# Patient Record
Sex: Female | Born: 1965 | Race: White | Hispanic: No | Marital: Married | Smoking: Never smoker
Health system: Southern US, Community
[De-identification: ages and names within clinical notes are randomized; demographics above are authoritative.]

## PROBLEM LIST (undated history)

## (undated) DIAGNOSIS — E559 Vitamin D deficiency, unspecified: Secondary | ICD-10-CM

## (undated) DIAGNOSIS — N920 Excessive and frequent menstruation with regular cycle: Secondary | ICD-10-CM

## (undated) DIAGNOSIS — T7840XA Allergy, unspecified, initial encounter: Secondary | ICD-10-CM

## (undated) DIAGNOSIS — J45909 Unspecified asthma, uncomplicated: Secondary | ICD-10-CM

## (undated) DIAGNOSIS — D509 Iron deficiency anemia, unspecified: Secondary | ICD-10-CM

## (undated) DIAGNOSIS — Z86718 Personal history of other venous thrombosis and embolism: Secondary | ICD-10-CM

## (undated) DIAGNOSIS — D689 Coagulation defect, unspecified: Secondary | ICD-10-CM

## (undated) DIAGNOSIS — Z862 Personal history of diseases of the blood and blood-forming organs and certain disorders involving the immune mechanism: Secondary | ICD-10-CM

## (undated) HISTORY — DX: Coagulation defect, unspecified: D68.9

## (undated) HISTORY — DX: Excessive and frequent menstruation with regular cycle: N92.0

## (undated) HISTORY — DX: Unspecified asthma, uncomplicated: J45.909

## (undated) HISTORY — DX: Iron deficiency anemia, unspecified: D50.9

## (undated) HISTORY — DX: Allergy, unspecified, initial encounter: T78.40XA

## (undated) HISTORY — DX: Personal history of other venous thrombosis and embolism: Z86.718

## (undated) HISTORY — PX: TUBAL LIGATION: SHX77

## (undated) HISTORY — DX: Personal history of diseases of the blood and blood-forming organs and certain disorders involving the immune mechanism: Z86.2

## (undated) HISTORY — DX: Vitamin D deficiency, unspecified: E55.9

---

## 2001-10-20 ENCOUNTER — Ambulatory Visit (HOSPITAL_COMMUNITY): Admission: RE | Admit: 2001-10-20 | Discharge: 2001-10-20 | Payer: Self-pay | Admitting: Family Medicine

## 2001-10-20 ENCOUNTER — Encounter: Payer: Self-pay | Admitting: Family Medicine

## 2002-02-01 ENCOUNTER — Inpatient Hospital Stay (HOSPITAL_COMMUNITY): Admission: AD | Admit: 2002-02-01 | Discharge: 2002-02-04 | Payer: Self-pay | Admitting: Obstetrics & Gynecology

## 2002-02-13 ENCOUNTER — Inpatient Hospital Stay (HOSPITAL_COMMUNITY): Admission: AD | Admit: 2002-02-13 | Discharge: 2002-02-18 | Payer: Self-pay | Admitting: Family Medicine

## 2002-02-13 ENCOUNTER — Encounter: Payer: Self-pay | Admitting: Family Medicine

## 2002-02-14 ENCOUNTER — Encounter: Payer: Self-pay | Admitting: Family Medicine

## 2002-05-18 ENCOUNTER — Ambulatory Visit (HOSPITAL_COMMUNITY): Admission: RE | Admit: 2002-05-18 | Discharge: 2002-05-18 | Payer: Self-pay | Admitting: Family Medicine

## 2002-05-18 ENCOUNTER — Encounter: Payer: Self-pay | Admitting: Family Medicine

## 2003-04-18 ENCOUNTER — Other Ambulatory Visit: Admission: RE | Admit: 2003-04-18 | Discharge: 2003-04-18 | Payer: Self-pay | Admitting: Family Medicine

## 2003-04-25 ENCOUNTER — Encounter: Admission: RE | Admit: 2003-04-25 | Discharge: 2003-04-25 | Payer: Self-pay | Admitting: Family Medicine

## 2003-04-25 ENCOUNTER — Encounter: Payer: Self-pay | Admitting: Family Medicine

## 2003-08-15 ENCOUNTER — Ambulatory Visit (HOSPITAL_COMMUNITY): Admission: RE | Admit: 2003-08-15 | Discharge: 2003-08-15 | Payer: Self-pay | Admitting: Family Medicine

## 2012-11-02 ENCOUNTER — Other Ambulatory Visit: Payer: Self-pay

## 2012-11-02 MED ORDER — FLUTICASONE PROPIONATE 50 MCG/ACT NA SUSP: 2.0000 | Freq: Every day | NASAL | Status: DC

## 2012-11-16 ENCOUNTER — Telehealth: Payer: Self-pay | Admitting: Family Medicine

## 2012-11-16 NOTE — Telephone Encounter (Signed)
Appt made for 11-22-12

## 2012-11-22 ENCOUNTER — Ambulatory Visit (INDEPENDENT_AMBULATORY_CARE_PROVIDER_SITE_OTHER): Payer: Managed Care, Other (non HMO) | Admitting: Family Medicine

## 2012-11-22 VITALS — BP 132/83 | HR 74 | Temp 98.7°F | Resp 17 | Wt 140.0 lb

## 2012-11-22 DIAGNOSIS — R5383 Other fatigue: Secondary | ICD-10-CM | POA: Insufficient documentation

## 2012-11-22 DIAGNOSIS — J309 Allergic rhinitis, unspecified: Secondary | ICD-10-CM

## 2012-11-22 DIAGNOSIS — Z639 Problem related to primary support group, unspecified: Secondary | ICD-10-CM

## 2012-11-22 DIAGNOSIS — J302 Other seasonal allergic rhinitis: Secondary | ICD-10-CM

## 2012-11-22 DIAGNOSIS — R5381 Other malaise: Secondary | ICD-10-CM

## 2012-11-22 DIAGNOSIS — F439 Reaction to severe stress, unspecified: Secondary | ICD-10-CM

## 2012-11-22 DIAGNOSIS — B070 Plantar wart: Secondary | ICD-10-CM | POA: Insufficient documentation

## 2012-11-22 LAB — COMPLETE METABOLIC PANEL WITH GFR
ALT: 17 U/L (ref 0–35)
AST: 20 U/L (ref 0–37)
Albumin: 4.1 g/dL (ref 3.5–5.2)
Alkaline Phosphatase: 61 U/L (ref 39–117)
BUN: 5 mg/dL — ABNORMAL LOW (ref 6–23)
CO2: 31 mEq/L (ref 19–32)
Calcium: 9.5 mg/dL (ref 8.4–10.5)
Chloride: 102 mEq/L (ref 96–112)
Creat: 0.78 mg/dL (ref 0.50–1.10)
GFR, Est African American: >89 mL/min
GFR, Est Non African American: >89 mL/min
Glucose, Bld: 84 mg/dL (ref 70–99)
Potassium: 4.1 mEq/L (ref 3.5–5.3)
Sodium: 137 mEq/L (ref 135–145)
Total Bilirubin: 0.7 mg/dL (ref 0.3–1.2)
Total Protein: 6.7 g/dL (ref 6.0–8.3)

## 2012-11-22 LAB — POCT CBC
Granulocyte percent: 66.6 %G (ref 37–80)
HCT, POC: 41.5 % (ref 37.7–47.9)
Hemoglobin: 14.5 g/dL (ref 12.2–16.2)
Lymph, poc: 1.7 (ref 0.6–3.4)
MCH, POC: 32.7 pg — AB (ref 27–31.2)
MCHC: 34.8 g/dL (ref 31.8–35.4)
MCV: 93.7 fL (ref 80–97)
MPV: 6.9 fL (ref 0–99.8)
POC Granulocyte: 4.5 (ref 2–6.9)
POC LYMPH PERCENT: 25.7 %L (ref 10–50)
Platelet Count, POC: 276 10*3/uL (ref 142–424)
RBC: 4.4 M/uL (ref 4.04–5.48)
RDW, POC: 13.5 %
WBC: 6.7 10*3/uL (ref 4.6–10.2)

## 2012-11-22 LAB — TSH: TSH: 0.605 u[IU]/mL (ref 0.350–4.500)

## 2012-11-22 MED ORDER — CETIRIZINE HCL 10 MG PO TABS: 10.0000 mg | ORAL_TABLET | Freq: Every day | ORAL | Status: DC

## 2012-11-22 MED ORDER — AZELASTINE HCL 0.1 % NA SOLN: 1.0000 | Freq: Two times a day (BID) | NASAL | Status: DC

## 2012-11-22 NOTE — Progress Notes (Signed)
Patient ID: Krystal Mccoy, female   DOB: 08-Jan-1966, 47 y.o.   MRN: [id number] SUBJECTIVE: HPI: Comes in mainly for allergies. Congested for:       has had runny and itchy eyes. has had runny, itchy and stuffy nose. has had Sneezing as well. has not had Coughing has not had Wheezing  Medications used for this problem:none has not had effective response. Fatigue Patient complains of fatigue. Symptoms began about a month ago. The patient feels the fatigue began with: ever since she has had a grandchild to help take care of. Symptoms of her fatigue have been general malaise. Patient describes the following psychological symptoms: stress in the family. Patient denies change in hair texture, cold intolerance, constipation, excessive menstrual bleeding, exercise intolerance, fever, GI blood loss, significant change in weight, symptoms of arthritis, unusual rashes and witnessed or suspected sleep apnea. Symptoms have stabilized. Symptom severity: moderate. Previous visits for this problem: none.   PMH/PSH: reviewed/updated in Epic  SH/FH: reviewed/updated in Epic  Allergies: reviewed/updated in Epic  Medications: reviewed/updated in Epic  Immunizations: reviewed/updated in Epic  ROS: As above in the HPI. All other systems are stable or negative.  OBJECTIVE: APPEARANCE:  Patient in no acute distress.The patient appeared well nourished and normally developed. Acyanotic. Waist: VITAL SIGNS:There were no vitals taken for this visit.   SKIN: warm and  Dry without overt rashes, tattoos and scars  HEAD and Neck: without JVD, Head and scalp: normal Eyes:No scleral icterus. Fundi normal, eye movements normal.lacrimating , conjunctiva congested. Ears: Auricle normal, canal normal, Tympanic membranes normal, insufflation normal. Nose: swollen,boggy nasal mucosa. Rhinorrhea. Throat: post nasal drip Neck & thyroid: normal  CHEST & LUNGS: Chest wall: normal Lungs: Clear  CVS: Reveals  the PMI to be normally located. Regular rhythm, First and Second Heart sounds are normal,  absence of murmurs, rubs or gallops. Peripheral vasculature: Radial pulses: normal Dorsal pedis pulses: normal Posterior pulses: normal  ABDOMEN:  Appearance: normal Benign,, no organomegaly, no masses, no Abdominal Aortic enlargement. No Guarding , no rebound. No Bruits. Bowel sounds: normal  RECTAL: N/A GU: N/A  EXTREMETIES: nonedematous. Both Femoral and Pedal pulses are normal.  MUSCULOSKELETAL:  Spine: normal Joints: intact  NEUROLOGIC: oriented to time,place and person; nonfocal. Strength is normal Sensory is normal Reflexes are normal Cranial Nerves are normal.  ASSESSMENT: Seasonal allergic rhinitis - Plan: azelastine (ASTELIN) 137 MCG/SPRAY nasal spray, cetirizine (ZYRTEC) 10 MG tablet  Other malaise and fatigue - Plan: POCT CBC, COMPLETE METABOLIC PANEL WITH GFR, TSH, Vitamin D 25 hydroxy  Plantar wart of right foot - 4th toe  Stress at home  PLAN: Orders Placed This Encounter  Procedures  . COMPLETE METABOLIC PANEL WITH GFR  . TSH  . Vitamin D 25 hydroxy  . POCT CBC   Results for orders placed in visit on 11/22/12 (from the past 24 hour(s))  POCT CBC     Status: Abnormal   Collection Time    11/22/12 11:50 AM      Result Value Range   WBC 6.7  4.6 - 10.2 K/uL   Lymph, poc 1.7  0.6 - 3.4   POC LYMPH PERCENT 25.7  10 - 50 %L   POC Granulocyte 4.5  2 - 6.9   Granulocyte percent 66.6  37 - 80 %G   RBC 4.4  4.04 - 5.48 M/uL   Hemoglobin 14.5  12.2 - 16.2 g/dL   HCT, POC 40.9  81.1 - 47.9 %   MCV 93.7  80 - 97 fL   MCH, POC 32.7 (*) 27 - 31.2 pg   MCHC 34.8  31.8 - 35.4 g/dL   RDW, POC 29.5     Platelet Count, POC 276.0  142 - 424 K/uL   MPV 6.9  0 - 99.8 fL   Meds ordered this encounter  Medications  . azelastine (ASTELIN) 137 MCG/SPRAY nasal spray    Sig: Place 1 spray into the nose 2 (two) times daily. Use in each nostril as directed    Dispense:   30 mL    Refill:  5  . cetirizine (ZYRTEC) 10 MG tablet    Sig: Take 1 tablet (10 mg total) by mouth daily.    Dispense:  30 tablet    Refill:  5  stress reduction. Avoid  Sleep deprivation. Await lab results. RTC prn.  Tequisha Maahs P. Modesto Charon, M.D.

## 2012-11-22 NOTE — Patient Instructions (Signed)
Stress Stress-related medical problems are becoming increasingly common. The body has a built-in physical response to stressful situations. Faced with pressure, challenge or danger, we need to react quickly. Our bodies release hormones such as cortisol and adrenaline to help do this. These hormones are part of the "fight or flight" response and affect the metabolic rate, heart rate and blood pressure, resulting in a heightened, stressed state that prepares the body for optimum performance in dealing with a stressful situation. It is likely that early man required these mechanisms to stay alive, but usually modern stresses do not call for this, and the same hormones released in today's world can damage health and reduce coping ability. CAUSES  Pressure to perform at work, at school or in sports.  Threats of physical violence.  Money worries.  Arguments.  Family conflicts.  Divorce or separation from significant other.  Bereavement.  New job or unemployment.  Changes in location.  Alcohol or drug abuse. SOMETIMES, THERE IS NO PARTICULAR REASON FOR DEVELOPING STRESS. Almost all people are at risk of being stressed at some time in their lives. It is important to know that some stress is temporary and some is long term.  Temporary stress will go away when a situation is resolved. Most people can cope with short periods of stress, and it can often be relieved by relaxing, taking a walk, chatting through issues with friends, or having a good night's sleep.  Chronic (long-term, continuous) stress is much harder to deal with. It can be psychologically and emotionally damaging. It can be harmful both for an individual and for friends and family. SYMPTOMS Everyone reacts to stress differently. There are some common effects that help us recognize it. In times of extreme stress, people may:  Shake uncontrollably.  Breathe faster and deeper than normal (hyperventilate).  Vomit.  For people  with asthma, stress can trigger an attack.  For some people, stress may trigger migraine headaches, ulcers, and body pain. PHYSICAL EFFECTS OF STRESS MAY INCLUDE:  Loss of energy.  Skin problems.  Aches and pains resulting from tense muscles, including neck ache, backache and tension headaches.  Increased pain from arthritis and other conditions.  Irregular heart beat (palpitations).  Periods of irritability or anger.  Apathy or depression.  Anxiety (feeling uptight or worrying).  Unusual behavior.  Loss of appetite.  Comfort eating.  Lack of concentration.  Loss of, or decreased, sex-drive.  Increased smoking, drinking, or recreational drug use.  For women, missed periods.  Ulcers, joint pain, and muscle pain. Post-traumatic stress is the stress caused by any serious accident, strong emotional damage, or extremely difficult or violent experience such as rape or war. Post-traumatic stress victims can experience mixtures of emotions such as fear, shame, depression, guilt or anger. It may include recurrent memories or images that may be haunting. These feelings can last for weeks, months or even years after the traumatic event that triggered them. Specialized treatment, possibly with medicines and psychological therapies, is available. If stress is causing physical symptoms, severe distress or making it difficult for you to function as normal, it is worth seeing your caregiver. It is important to remember that although stress is a usual part of life, extreme or prolonged stress can lead to other illnesses that will need treatment. It is better to visit a doctor sooner rather than later. Stress has been linked to the development of high blood pressure and heart disease, as well as insomnia and depression. There is no diagnostic test for   stress since everyone reacts to it differently. But a caregiver will be able to spot the physical symptoms, such  as:  Headaches.  Shingles.  Ulcers. Emotional distress such as intense worry, low mood or irritability should be detected when the doctor asks pertinent questions to identify any underlying problems that might be the cause. In case there are physical reasons for the symptoms, the doctor may also want to do some tests to exclude certain conditions. If you feel that you are suffering from stress, try to identify the aspects of your life that are causing it. Sometimes you may not be able to change or avoid them, but even a small change can have a positive ripple effect. A simple lifestyle change can make all the difference. STRATEGIES THAT CAN HELP DEAL WITH STRESS:  Delegating or sharing responsibilities.  Avoiding confrontations.  Learning to be more assertive.  Regular exercise.  Avoid using alcohol or street drugs to cope.  Eating a healthy, balanced diet, rich in fruit and vegetables and proteins.  Finding humor or absurdity in stressful situations.  Never taking on more than you know you can handle comfortably.  Organizing your time better to get as much done as possible.  Talking to friends or family and sharing your thoughts and fears.  Listening to music or relaxation tapes.  Tensing and then relaxing your muscles, starting at the toes and working up to the head and neck. If you think that you would benefit from help, either in identifying the things that are causing your stress or in learning techniques to help you relax, see a caregiver who is capable of helping you with this. Rather than relying on medications, it is usually better to try and identify the things in your life that are causing stress and try to deal with them. There are many techniques of managing stress including counseling, psychotherapy, aromatherapy, yoga, and exercise. Your caregiver can help you determine what is best for you. Document Released: 10/11/2002 Document Revised: 10/13/2011 Document  Reviewed: 09/07/2007 [employer] Patient Information 2013 [employer].  

## 2012-11-23 ENCOUNTER — Other Ambulatory Visit: Payer: Self-pay | Admitting: Family Medicine

## 2012-11-23 ENCOUNTER — Encounter: Payer: Self-pay | Admitting: Family Medicine

## 2012-11-23 DIAGNOSIS — E559 Vitamin D deficiency, unspecified: Secondary | ICD-10-CM

## 2012-11-23 LAB — VITAMIN D 25 HYDROXY (VIT D DEFICIENCY, FRACTURES): Vit D, 25-Hydroxy: 18 ng/mL — ABNORMAL LOW (ref 30–89)

## 2012-11-23 MED ORDER — VITAMIN D (ERGOCALCIFEROL) 1.25 MG (50000 UNIT) PO CAPS: 50000.0000 [IU] | ORAL_CAPSULE | ORAL | Status: DC

## 2012-11-23 NOTE — Progress Notes (Signed)
Quick Note:  Labs abnormal. VitaminD very low. Needs to start on Vit D 50,000 IU weekly for 12 weeks then she needs to have a vit D level measured. Rx and labs ordered in EPIc  ______

## 2012-12-07 ENCOUNTER — Telehealth: Payer: Self-pay | Admitting: *Deleted

## 2012-12-07 NOTE — Telephone Encounter (Signed)
Needs another office visit to recheck. She was stressed and weak all over, since she has a grandchild to take care of.. Now its pain and weakness in the legs. OV next week. Continue with flonase, astepro, and anti-histamine and Vitamin D, in the meantime.

## 2012-12-07 NOTE — Telephone Encounter (Signed)
Continues to have weakness and a pain in both legs. Reports that the pain and weakness has worsened since her last visit.  She applies a heating pad at night to help with pain but it only provides minimal relief.  She has started taking the Vit D that was prescribed.    Also complains that her allergy symptoms have not resolved either.

## 2012-12-07 NOTE — Telephone Encounter (Signed)
SPOKE WITH PT  APPT 12-15-12 AT 10;40

## 2012-12-15 ENCOUNTER — Ambulatory Visit (INDEPENDENT_AMBULATORY_CARE_PROVIDER_SITE_OTHER): Payer: Managed Care, Other (non HMO) | Admitting: Family Medicine

## 2012-12-15 ENCOUNTER — Ambulatory Visit (INDEPENDENT_AMBULATORY_CARE_PROVIDER_SITE_OTHER): Payer: Managed Care, Other (non HMO)

## 2012-12-15 ENCOUNTER — Encounter: Payer: Self-pay | Admitting: Family Medicine

## 2012-12-15 VITALS — BP 118/75 | HR 70 | Temp 97.0°F | Wt 140.7 lb

## 2012-12-15 DIAGNOSIS — J302 Other seasonal allergic rhinitis: Secondary | ICD-10-CM

## 2012-12-15 DIAGNOSIS — R5381 Other malaise: Secondary | ICD-10-CM

## 2012-12-15 DIAGNOSIS — G8929 Other chronic pain: Secondary | ICD-10-CM | POA: Insufficient documentation

## 2012-12-15 DIAGNOSIS — M549 Dorsalgia, unspecified: Secondary | ICD-10-CM

## 2012-12-15 DIAGNOSIS — J309 Allergic rhinitis, unspecified: Secondary | ICD-10-CM

## 2012-12-15 DIAGNOSIS — M25561 Pain in right knee: Secondary | ICD-10-CM | POA: Insufficient documentation

## 2012-12-15 DIAGNOSIS — M25559 Pain in unspecified hip: Secondary | ICD-10-CM

## 2012-12-15 LAB — POCT CBC
Granulocyte percent: 76.7 %G (ref 37–80)
HCT, POC: 43.1 % (ref 37.7–47.9)
Hemoglobin: 14.4 g/dL (ref 12.2–16.2)
Lymph, poc: 1.7 (ref 0.6–3.4)
MCH, POC: 31.8 pg — AB (ref 27–31.2)
MCHC: 33.3 g/dL (ref 31.8–35.4)
MCV: 95.6 fL (ref 80–97)
MPV: 7.4 fL (ref 0–99.8)
POC Granulocyte: 7.3 — AB (ref 2–6.9)
POC LYMPH PERCENT: 18.1 %L (ref 10–50)
Platelet Count, POC: 301 10*3/uL (ref 142–424)
RBC: 4.5 M/uL (ref 4.04–5.48)
RDW, POC: 12.8 %
WBC: 9.5 10*3/uL (ref 4.6–10.2)

## 2012-12-15 LAB — VITAMIN B12: Vitamin B-12: 687 pg/mL (ref 211–911)

## 2012-12-15 LAB — FOLATE: Folate: >20 ng/mL

## 2012-12-15 LAB — CK: Total CK: 47 U/L (ref 7–177)

## 2012-12-15 MED ORDER — FLUTICASONE PROPIONATE 50 MCG/ACT NA SUSP: 2.0000 | Freq: Every day | NASAL | Status: DC

## 2012-12-15 MED ORDER — DESLORATADINE 5 MG PO TABS: 5.0000 mg | ORAL_TABLET | Freq: Every day | ORAL | Status: DC

## 2012-12-15 NOTE — Progress Notes (Signed)
Patient ID: Krystal Mccoy, female   DOB: 05/28/1966, 47 y.o.   MRN: [id number] SUBJECTIVE: HPI:  SUBJECTIVE: HPI: Complains, nothing works, has had persistent allergies fatigue and legs hurt and back sore. Has a lot of stresses related to Grandchild and family. Claims  Sleep is better but only sleeps only a few hours.   PMH/PSH: reviewed/updated in Epic  SH/FH: reviewed/updated in Epic  Allergies: reviewed/updated in Epic  Medications: reviewed/updated in Epic  Immunizations: reviewed/updated in Epic  ROS: As above in the HPI. All other systems are stable or negative.  OBJECTIVE: APPEARANCE: WF very active and fast talking. Patient in no acute distress.The patient appeared well nourished and normally developed. Acyanotic. Waist: VITAL SIGNS:BP 118/75  Pulse 70  Temp(Src) 97 F (36.1 C) (Oral)  Wt 140 lb 11.2 oz (63.821 kg)   SKIN: warm and  Dry without overt rashes, tattoos and scars  HEAD and Neck: without JVD, Head and scalp: normal Eyes:No scleral icterus. Fundi normal, eye movements normal. Ears: Auricle normal, canal normal, Tympanic membranes normal, insufflation normal. Nose: rhinitis mild. Throat: normal Neck & thyroid: normal  CHEST & LUNGS: Chest wall: normal Lungs: Clear  CVS: Reveals the PMI to be normally located. Regular rhythm, First and Second Heart sounds are normal,  absence of murmurs, rubs or gallops. Peripheral vasculature: Radial pulses: normal Dorsal pedis pulses: normal Posterior pulses: normal  ABDOMEN:  Appearance: normal Benign,, no organomegaly, no masses, no Abdominal Aortic enlargement. No Guarding , no rebound. No Bruits. Bowel sounds: normal  RECTAL: N/A GU: N/A  EXTREMETIES: nonedematous. Both Femoral and Pedal pulses are normal.  MUSCULOSKELETAL:  Spine:mild discomfort on FROM. SLR mildly positive Joints: intact Muscles, patient complains of leg muscle pains in all areas.   NEUROLOGIC: oriented to  time,place and person; nonfocal. Strength is normal Sensory is normal Reflexes are normal Cranial Nerves are normal.  ASSESSMENT: Back pain with radiation - Plan: DG Lumbar Spine 2-3 Views  Chronic arthralgias of knees and hips, unspecified laterality - Plan: Arthritis Panel, CK, Aldolase  Other malaise and fatigue - Plan: POCT CBC, Folate, Vitamin B12, Sedimentation rate  Seasonal allergic rhinitis - Plan: fluticasone (FLONASE) 50 MCG/ACT nasal spray, desloratadine (CLARINEX) 5 MG tablet  Suspect that her fatigue is due to stress , sleep deprivation and side effects of meds. Will adjust meds and evaluate labs as appropriate.  PLAN:  [facility] reading (PRIMARY) by  Dr. Modesto Charon: L5 bone spur. No acute findings  Seen.  Orders Placed This Encounter  Procedures  . DG Lumbar Spine 2-3 Views    Standing Status: Future     Number of Occurrences: 1     Standing Expiration Date: 02/14/2014    Order Specific Question:  Reason for Exam (SYMPTOM  OR DIAGNOSIS REQUIRED)    Answer:  back pain and  radiculopathy    Order Specific Question:  Is the patient pregnant?    Answer:  No    Order Specific Question:  Preferred imaging location?    Answer:  Internal  . Folate  . Vitamin B12  . Sedimentation rate  . Arthritis Panel  . CK  . Aldolase  . POCT CBC   Results for orders placed in visit on 12/15/12  POCT CBC      Result Value Range   WBC 9.5  4.6 - 10.2 K/uL   Lymph, poc 1.7  0.6 - 3.4   POC LYMPH PERCENT 18.1  10 - 50 %L   POC Granulocyte 7.3 (*) 2 -  6.9   Granulocyte percent 76.7  37 - 80 %G   RBC 4.5  4.04 - 5.48 M/uL   Hemoglobin 14.4  12.2 - 16.2 g/dL   HCT, POC 16.1  09.6 - 47.9 %   MCV 95.6  80 - 97 fL   MCH, POC 31.8 (*) 27 - 31.2 pg   MCHC 33.3  31.8 - 35.4 g/dL   RDW, POC 04.5     Platelet Count, POC 301.0  142 - 424 K/uL   MPV 7.4  0 - 99.8 fL   Meds ordered this encounter  Medications  . FeFum-FePo-FA-B Cmp-C-Zn-Mn-Cu (SE-TAN PLUS) 162-115.2-1 MG CAPS    Sig:   .  DISCONTD: FLUoxetine (PROZAC) 20 MG capsule    Sig:   . DISCONTD: chlorpheniramine-HYDROcodone (TUSSIONEX) 10-8 MG/5ML LQCR    Sig:   . valACYclovir (VALTREX) 1000 MG tablet    Sig:   . aspirin 325 MG tablet    Sig: Take 325 mg by mouth daily.  . fluticasone (FLONASE) 50 MCG/ACT nasal spray    Sig: Place 2 sprays into the nose daily.    Dispense:  16 g    Refill:  4  . desloratadine (CLARINEX) 5 MG tablet    Sig: Take 1 tablet (5 mg total) by mouth daily.    Dispense:  30 tablet    Refill:  3   RTC 4 weeks  Kadeja Granada P. Modesto Charon, M.D.

## 2012-12-16 LAB — ARTHRITIS PANEL
Anti Nuclear Antibody(ANA): NEGATIVE
Rhuematoid fact SerPl-aCnc: <10 IU/mL (ref <=14)
Uric Acid, Serum: 3.1 mg/dL (ref 2.4–6.0)

## 2012-12-16 LAB — SEDIMENTATION RATE: Sed Rate: 4 mm/hr (ref 0–22)

## 2012-12-16 NOTE — Addendum Note (Signed)
Addended by: Orma Render F on: 12/16/2012 12:06 PM   Modules accepted: Orders

## 2012-12-17 ENCOUNTER — Telehealth: Payer: Self-pay | Admitting: Family Medicine

## 2012-12-17 NOTE — Telephone Encounter (Signed)
CBC result given to pt and informed pt that all labs are not back and she verbalized understanding that she will call back next week.

## 2012-12-18 LAB — ALDOLASE: Aldolase: 5.3 U/L (ref <=8.1)

## 2012-12-22 ENCOUNTER — Telehealth: Payer: Self-pay | Admitting: Family Medicine

## 2012-12-22 NOTE — Telephone Encounter (Signed)
Work normal, so far. Keep follow up in 4 weeks. May need to see a therapist.

## 2012-12-22 NOTE — Telephone Encounter (Signed)
Pt notified that labs were normal. Pt states she is still having leg pain and having to use heating pad at night. Also still feeling very weak and drained. What to do next? Please advise

## 2012-12-28 NOTE — Telephone Encounter (Signed)
Pt notified and questions answered

## 2012-12-28 NOTE — Telephone Encounter (Signed)
5-21 LM to CB  MT

## 2012-12-30 ENCOUNTER — Telehealth: Payer: Self-pay | Admitting: Family Medicine

## 2012-12-30 NOTE — Telephone Encounter (Signed)
Right side of nose is stopped up she cant catch her breath from it. Been going on for for days do you think it is going on from the nose spray you put her on fluticasone?

## 2013-01-04 ENCOUNTER — Telehealth: Payer: Self-pay | Admitting: Family Medicine

## 2013-01-04 NOTE — Telephone Encounter (Signed)
Feels like nose stuffy on right side rt ear feels like fluid in it.  Coughing non productive  No fever  Using nasal spray and claritin

## 2013-01-11 ENCOUNTER — Telehealth: Payer: Self-pay | Admitting: Family Medicine

## 2013-01-11 DIAGNOSIS — J3489 Other specified disorders of nose and nasal sinuses: Secondary | ICD-10-CM

## 2013-01-11 NOTE — Telephone Encounter (Signed)
SPOKE WITH PT AND SHE VERBALIZED HER MESSAGES HAVE BEEN ANSWERED. BUT SHE IS STILL HAVING STUFFY NOSE  PER DR WONG TRY SALINE SPRAY AND USE CLAIRTIN AND WE WOULD REFER TO ALLERGIST.  ;PT WANTS A Monday APPT AND WEEK OF June 23 WOULD BE GOOD

## 2013-01-11 NOTE — Progress Notes (Signed)
Quick Note:  Call patient. Labs normal. No change in plan. ______ 

## 2013-01-17 ENCOUNTER — Ambulatory Visit: Payer: Managed Care, Other (non HMO) | Admitting: Family Medicine

## 2013-02-07 ENCOUNTER — Ambulatory Visit: Payer: Managed Care, Other (non HMO) | Admitting: Family Medicine

## 2013-02-07 ENCOUNTER — Telehealth: Payer: Self-pay | Admitting: Family Medicine

## 2013-02-07 NOTE — Telephone Encounter (Signed)
Left message on pt answering machine order is in computer advised to call for lab appt.

## 2013-02-14 ENCOUNTER — Other Ambulatory Visit (INDEPENDENT_AMBULATORY_CARE_PROVIDER_SITE_OTHER): Payer: Managed Care, Other (non HMO)

## 2013-02-14 DIAGNOSIS — E559 Vitamin D deficiency, unspecified: Secondary | ICD-10-CM

## 2013-02-14 NOTE — Progress Notes (Signed)
Pt came in for labs only 

## 2013-02-15 LAB — VITAMIN D 25 HYDROXY (VIT D DEFICIENCY, FRACTURES): Vit D, 25-Hydroxy: 53 ng/mL (ref 30–89)

## 2013-02-16 NOTE — Progress Notes (Signed)
Quick Note:  Lab result at goal. No change in Medications for now. No Change in plans and follow up. ______ 

## 2013-02-21 ENCOUNTER — Telehealth: Payer: Self-pay | Admitting: Family Medicine

## 2013-02-21 NOTE — Telephone Encounter (Signed)
Ear pain x 3 days.  Has taken ibuprofen and applied heat.  Appt scheduled for tomorrow.  Continue ibuprofen and heat.  Add decongestant.

## 2013-02-22 ENCOUNTER — Encounter: Payer: Self-pay | Admitting: Family Medicine

## 2013-02-22 ENCOUNTER — Ambulatory Visit (INDEPENDENT_AMBULATORY_CARE_PROVIDER_SITE_OTHER): Payer: Managed Care, Other (non HMO) | Admitting: Family Medicine

## 2013-02-22 VITALS — BP 138/71 | HR 71 | Temp 97.3°F | Ht 67.0 in | Wt 142.0 lb

## 2013-02-22 DIAGNOSIS — H8110 Benign paroxysmal vertigo, unspecified ear: Secondary | ICD-10-CM

## 2013-02-22 DIAGNOSIS — H60399 Other infective otitis externa, unspecified ear: Secondary | ICD-10-CM

## 2013-02-22 DIAGNOSIS — H6091 Unspecified otitis externa, right ear: Secondary | ICD-10-CM

## 2013-02-22 DIAGNOSIS — H6690 Otitis media, unspecified, unspecified ear: Secondary | ICD-10-CM

## 2013-02-22 MED ORDER — AMOXICILLIN 875 MG PO TABS: 875.0000 mg | ORAL_TABLET | Freq: Two times a day (BID) | ORAL | Status: DC

## 2013-02-22 MED ORDER — NEOMYCIN-POLYMYXIN-HC 3.5-10000-1 OT SOLN: 3.0000 [drp] | Freq: Four times a day (QID) | OTIC | Status: DC

## 2013-02-22 MED ORDER — MECLIZINE HCL 25 MG PO TABS: 25.0000 mg | ORAL_TABLET | Freq: Three times a day (TID) | ORAL | Status: DC | PRN

## 2013-02-22 NOTE — Progress Notes (Signed)
  Subjective:    Patient ID: Krystal Mccoy, female    DOB: 09-Nov-1965, 47 y.o.   MRN: [id number]  HPI EAR PAIN Location:  R ear  Description: R ear pain and discomfort  Onset:  3-4 days  Modifying factors: none   Symptoms  Sensation of fullness: yes Ear discharge: minimal  URI symptoms: minimal   Fever: no Tinnitus:no   Dizziness:yes; vertiginous sxs    Hearing loss:no   Toothache: nmild Rashes or lesions: no Facial muscle weakness: no  Red Flags Recent trauma: no PMH prior ear surgery:  no Diabetes or Immunosuppresion: no      Review of Systems  All other systems reviewed and are negative.       Objective:   Physical Exam  Constitutional: She appears well-developed and well-nourished.  HENT:  Head: Normocephalic and atraumatic.  Left Ear: External ear normal.  R ear canal erythema and tenderness to otoscopic evaluation R TM bulging  +nasal erythema, rhinorrhea bilaterally, + post oropharyngeal erythema    Eyes: Conjunctivae are normal. Pupils are equal, round, and reactive to light.  Neck: Normal range of motion. Neck supple.  Cardiovascular: Normal rate and regular rhythm.   Pulmonary/Chest: Effort normal and breath sounds normal.  Abdominal: Soft.  Lymphadenopathy:    She has no cervical adenopathy.  Neurological: She is alert.  Skin: Skin is warm.  Dix-Hallpike positive         Assessment & Plan:  OE (otitis externa), right - Plan: neomycin-polymyxin-hydrocortisone (CORTISPORIN) otic solution  AOM (acute otitis media), right - Plan: amoxicillin (AMOXIL) 875 MG tablet  Benign paroxysmal positional vertigo - Plan: meclizine (ANTIVERT) 25 MG tablet  Plan as above Discussed ENT red flags.  Follow up as needed.

## 2013-03-04 ENCOUNTER — Telehealth: Payer: Self-pay

## 2013-03-04 ENCOUNTER — Other Ambulatory Visit: Payer: Self-pay | Admitting: Family Medicine

## 2013-03-04 DIAGNOSIS — Z8619 Personal history of other infectious and parasitic diseases: Secondary | ICD-10-CM

## 2013-03-04 MED ORDER — ACYCLOVIR 5 % EX OINT: TOPICAL_OINTMENT | CUTANEOUS | Status: AC

## 2013-03-04 NOTE — Telephone Encounter (Signed)
Rx sent through [facility]

## 2013-04-05 ENCOUNTER — Ambulatory Visit (INDEPENDENT_AMBULATORY_CARE_PROVIDER_SITE_OTHER): Payer: Managed Care, Other (non HMO)

## 2013-04-05 ENCOUNTER — Encounter: Payer: Self-pay | Admitting: Family Medicine

## 2013-04-05 ENCOUNTER — Ambulatory Visit (INDEPENDENT_AMBULATORY_CARE_PROVIDER_SITE_OTHER): Payer: Managed Care, Other (non HMO) | Admitting: Family Medicine

## 2013-04-05 VITALS — BP 140/72 | HR 73 | Temp 97.4°F | Ht 67.0 in | Wt 145.0 lb

## 2013-04-05 DIAGNOSIS — R531 Weakness: Secondary | ICD-10-CM

## 2013-04-05 DIAGNOSIS — R1032 Left lower quadrant pain: Secondary | ICD-10-CM

## 2013-04-05 DIAGNOSIS — R5381 Other malaise: Secondary | ICD-10-CM

## 2013-04-05 LAB — POCT UA - MICROSCOPIC ONLY
Casts, Ur, LPF, POC: NEGATIVE
Crystals, Ur, HPF, POC: NEGATIVE

## 2013-04-05 LAB — POCT CBC
Granulocyte percent: 67.2 %G (ref 37–80)
HCT, POC: 39.9 % (ref 37.7–47.9)
Hemoglobin: 13.3 g/dL (ref 12.2–16.2)
Lymph, poc: 1.8 (ref 0.6–3.4)
MCH, POC: 30 pg (ref 27–31.2)
MCHC: 33.4 g/dL (ref 31.8–35.4)
MCV: 89.8 fL (ref 80–97)
MPV: 7.4 fL (ref 0–99.8)
POC Granulocyte: 4.3 (ref 2–6.9)
POC LYMPH PERCENT: 28.9 %L (ref 10–50)
Platelet Count, POC: 299 10*3/uL (ref 142–424)
RBC: 4.4 M/uL (ref 4.04–5.48)
RDW, POC: 11.6 %
WBC: 6.4 10*3/uL (ref 4.6–10.2)

## 2013-04-05 LAB — POCT URINALYSIS DIPSTICK
Bilirubin, UA: NEGATIVE
Blood, UA: NEGATIVE
Glucose, UA: NEGATIVE
Ketones, UA: NEGATIVE
Leukocytes, UA: NEGATIVE
Nitrite, UA: NEGATIVE
Protein, UA: NEGATIVE
Spec Grav, UA: 1.02
Urobilinogen, UA: NEGATIVE
pH, UA: 5

## 2013-04-05 MED ORDER — METRONIDAZOLE 500 MG PO TABS: 500.0000 mg | ORAL_TABLET | Freq: Three times a day (TID) | ORAL | Status: DC

## 2013-04-05 MED ORDER — CIPROFLOXACIN HCL 500 MG PO TABS: 500.0000 mg | ORAL_TABLET | Freq: Two times a day (BID) | ORAL | Status: DC

## 2013-04-05 NOTE — Progress Notes (Signed)
  Subjective:    Patient ID: Krystal Mccoy, female    DOB: 09-01-1965, 47 y.o.   MRN: [id number]  HPI This 47 y.o. female presents for evaluation of fatigue and malaise for over a month. She also c/o left lower abdominal pain.  She states she is sleeping all the time and is Not having any energy.    Review of Systems   No chest pain, SOB, HA, dizziness, vision change, N/V, diarrhea, constipation, dysuria, urinary urgency or frequency, myalgias, arthralgias or rash.  Objective:   Physical Exam Vital signs noted  Well developed well nourished female.  HEENT - Head atraumatic Normocephalic                Throat - oropharanx wnl Respiratory - Lungs CTA bilateral Cardiac - RRR S1 and S2 without murmur GI - Abdomen tender LLQ and bowel sounds active x 4 Extremities - No edema. Neuro - Grossly intact.        Results for orders placed in visit on 04/05/13  POCT URINALYSIS DIPSTICK      Result Value Range   Color, UA yellow     Clarity, UA clear     Glucose, UA neg     Bilirubin, UA neg     Ketones, UA neg     Spec Grav, UA 1.020     Blood, UA neg     pH, UA 5.0     Protein, UA neg     Urobilinogen, UA negative     Nitrite, UA neg     Leukocytes, UA Negative    POCT UA - MICROSCOPIC ONLY      Result Value Range   WBC, Ur, HPF, POC occ     RBC, urine, microscopic occ     Bacteria, U Microscopic few     Mucus, UA mod     Epithelial cells, urine per micros few     Crystals, Ur, HPF, POC neg     Casts, Ur, LPF, POC neg     Yeast, UA occ    POCT CBC      Result Value Range   WBC 6.4  4.6 - 10.2 K/uL   Lymph, poc 1.8  0.6 - 3.4   POC LYMPH PERCENT 28.9  10 - 50 %L   POC Granulocyte 4.3  2 - 6.9   Granulocyte percent 67.2  37 - 80 %G   RBC 4.4  4.04 - 5.48 M/uL   Hemoglobin 13.3  12.2 - 16.2 g/dL   HCT, POC 40.9  81.1 - 47.9 %   MCV 89.8  80 - 97 fL   MCH, POC 30.0  27 - 31.2 pg   MCHC 33.4  31.8 - 35.4 g/dL   RDW, POC 91.4     Platelet Count, POC 299.0  142 - 424  K/uL   MPV 7.4  0 - 99.8 fL   Assessment & Plan:  Weakness - Plan: POCT urinalysis dipstick, POCT UA - Microscopic Only, POCT CBC, CMP14+EGFR, ciprofloxacin (CIPRO) 500 MG tablet, metroNIDAZOLE (FLAGYL) 500 MG tablet  Abdominal pain, left lower quadrant - Plan: POCT CBC, DG Abd 1 View, ciprofloxacin (CIPRO) 500 MG tablet, metroNIDAZOLE (FLAGYL) 500 MG tablet.  Discussed with patient possible etiology is  Diverticulitis.  Follow up prn if not better.

## 2013-04-05 NOTE — Patient Instructions (Signed)

## 2013-04-06 LAB — CMP14+EGFR
ALT: 29 IU/L (ref 0–32)
AST: 26 IU/L (ref 0–40)
Albumin/Globulin Ratio: 2.1 (ref 1.1–2.5)
Albumin: 4.5 g/dL (ref 3.5–5.5)
Alkaline Phosphatase: 80 IU/L (ref 39–117)
BUN/Creatinine Ratio: 7 — ABNORMAL LOW (ref 9–23)
BUN: 6 mg/dL (ref 6–24)
CO2: 24 mmol/L (ref 18–29)
Calcium: 9.2 mg/dL (ref 8.7–10.2)
Chloride: 102 mmol/L (ref 97–108)
Creatinine, Ser: 0.84 mg/dL (ref 0.57–1.00)
GFR calc Af Amer: 96 mL/min/{1.73_m2} (ref >59)
GFR calc non Af Amer: 84 mL/min/{1.73_m2} (ref >59)
Globulin, Total: 2.1 g/dL (ref 1.5–4.5)
Glucose: 65 mg/dL (ref 65–99)
Potassium: 4.3 mmol/L (ref 3.5–5.2)
Sodium: 140 mmol/L (ref 134–144)
Total Bilirubin: 0.6 mg/dL (ref 0.0–1.2)
Total Protein: 6.6 g/dL (ref 6.0–8.5)

## 2013-06-13 ENCOUNTER — Ambulatory Visit (INDEPENDENT_AMBULATORY_CARE_PROVIDER_SITE_OTHER): Payer: Managed Care, Other (non HMO) | Admitting: Family Medicine

## 2013-06-13 ENCOUNTER — Encounter: Payer: Self-pay | Admitting: Family Medicine

## 2013-06-13 ENCOUNTER — Ambulatory Visit (INDEPENDENT_AMBULATORY_CARE_PROVIDER_SITE_OTHER): Payer: Managed Care, Other (non HMO)

## 2013-06-13 VITALS — BP 139/75 | HR 73 | Temp 98.1°F | Ht 65.0 in | Wt 148.6 lb

## 2013-06-13 DIAGNOSIS — M25569 Pain in unspecified knee: Secondary | ICD-10-CM

## 2013-06-13 DIAGNOSIS — R52 Pain, unspecified: Secondary | ICD-10-CM

## 2013-06-13 DIAGNOSIS — M25562 Pain in left knee: Secondary | ICD-10-CM

## 2013-06-13 MED ORDER — NAPROXEN 500 MG PO TABS: 500.0000 mg | ORAL_TABLET | Freq: Two times a day (BID) | ORAL | Status: DC

## 2013-06-13 NOTE — Progress Notes (Signed)
  Subjective:    Patient ID: Krystal Mccoy, female    DOB: [birth year], 47 y.o.   MRN: [id number]  HPI This 47 y.o. female presents for evaluation of left knee pain for 2 days. She has been experiencing moderate to severe knee arthralgias.   Review of Systems C/o left knee pain No chest pain, SOB, HA, dizziness, vision change, N/V, diarrhea, constipation, dysuria, urinary urgency or frequency or rash.     Objective:   Physical Exam Vital signs noted  Well developed well nourished female.  HEENT - Head atraumatic Normocephalic Respiratory - Lungs CTA bilateral Cardiac - RRR S1 and S2 without murmur Neuro - Grossly intact. MS - TTP left knee and discomfort with flexion and extension.  Procedure - 2 cc's of lidocaine 2 % w/o epi and one cc of depomedrol 80mg  injected Laterally under left patella after using topical anesthetic and cleansing site with ETOH pad.  Patient expresses relief immediately after injection.  Xray of left knee - No fx Prelimnary reading by Angeline Slim      Assessment & Plan:  Pain - Plan: DG Knee 1-2 Views Left  Knee pain, left - Plan: naproxen (NAPROSYN) 500 MG tablet po bid prn pain #30 Left knee injection.  Instructions given to take it easy on left knee for next couple days.  Deatra Canter FNP

## 2013-06-13 NOTE — Patient Instructions (Addendum)
Place knee pain patient instructions here. Knee Injection Joint injections are shots. Your caregiver will place a needle into your knee joint. The needle is used to put medicine into the joint. These shots can be used to help treat different painful knee conditions such as osteoarthritis, bursitis, local flare-ups of rheumatoid arthritis, and pseudogout. Anti-inflammatory medicines such as corticosteroids and anesthetics are the most common medicines used for joint and soft tissue injections.  PROCEDURE  The skin over the kneecap will be cleaned with an antiseptic solution.  Your caregiver will inject a small amount of a local anesthetic (a medicine like Novocaine) just under the skin in the area that was cleaned.  After the area becomes numb, a second injection is done. This second injection usually includes an anesthetic and an anti-inflammatory medicine called a steroid or cortisone. The needle is carefully placed in between the kneecap and the knee, and the medicine is injected into the joint space.  After the injection is done, the needle is removed. Your caregiver may place a bandage over the injection site. The whole procedure takes no more than a couple of minutes. BEFORE THE PROCEDURE  Wash all of the skin around the entire knee area. Try to remove any loose, scaling skin. There is no other specific preparation necessary unless advised otherwise by your caregiver. LET YOUR CAREGIVER KNOW ABOUT:   Allergies.  Medications taken including herbs, eye drops, over the counter medications, and creams.  Use of steroids (by mouth or creams).  Possible pregnancy, if applicable.  Previous problems with anesthetics or Novocaine.  History of blood clots (thrombophlebitis).  History of bleeding or blood problems.  Previous surgery.  Other health problems. RISKS AND COMPLICATIONS Side effects from cortisone shots are rare. They include:   Slight bruising of the skin.  Shrinkage of the  normal fatty tissue under the skin where the shot was given.  Increase in pain after the shot.  Infection.  Weakening of tendons or tendon rupture.  Allergic reaction to the medicine.  Diabetics may have a temporary increase in their blood sugar after a shot.  Cortisone can temporarily weaken the immune system. While receiving these shots, you should not get certain vaccines. Also, avoid contact with anyone who has chickenpox or measles. Especially if you have never had these diseases or have not been previously immunized. Your immune system may not be strong enough to fight off the infection while the cortisone is in your system. AFTER THE PROCEDURE   You can go home after the procedure.  You may need to put ice on the joint 15-20 minutes every 3 or 4 hours until the pain goes away.  You may need to put an elastic bandage on the joint. HOME CARE INSTRUCTIONS   Only take over-the-counter or prescription medicines for pain, discomfort, or fever as directed by your caregiver.  You should avoid stressing the joint. Unless advised otherwise, avoid activities that put a lot of pressure on a knee joint, such as:  Jogging.  Bicycling.  Recreational climbing.  Hiking.  Laying down and elevating the leg/knee above the level of your heart can help to minimize swelling. SEEK MEDICAL CARE IF:   You have repeated or worsening swelling.  There is drainage from the puncture area.  You develop red streaking that extends above or below the site where the needle was inserted. SEEK IMMEDIATE MEDICAL CARE IF:   You develop a fever.  You have pain that gets worse even though you are taking  pain medicine.  The area is red and warm, and you have trouble moving the joint. MAKE SURE YOU:   Understand these instructions.  Will watch your condition.  Will get help right away if you are not doing well or get worse. Document Released: 10/12/2006 Document Revised: 10/13/2011 Document  Reviewed: 07/09/2007 [facility] Patient Information 2014 [place], [place].

## 2013-07-04 ENCOUNTER — Ambulatory Visit (INDEPENDENT_AMBULATORY_CARE_PROVIDER_SITE_OTHER): Payer: Managed Care, Other (non HMO) | Admitting: Family Medicine

## 2013-07-04 ENCOUNTER — Encounter: Payer: Self-pay | Admitting: Family Medicine

## 2013-07-04 VITALS — BP 118/75 | HR 100 | Temp 97.8°F | Wt 148.0 lb

## 2013-07-04 DIAGNOSIS — J209 Acute bronchitis, unspecified: Secondary | ICD-10-CM

## 2013-07-04 MED ORDER — BENZONATATE 200 MG PO CAPS: 200.0000 mg | ORAL_CAPSULE | Freq: Two times a day (BID) | ORAL | Status: DC | PRN

## 2013-07-04 MED ORDER — AZITHROMYCIN 250 MG PO TABS: ORAL_TABLET | ORAL | Status: DC

## 2013-07-04 NOTE — Progress Notes (Signed)
   Subjective:    Patient ID: Krystal Mccoy, female    DOB: [birth year], 47 y.o.   MRN: [id number]  HPI  This 47 y.o. female presents for evaluation of URI sx's.  She has been coughing and  Has been feeling tired.  Review of Systems C/o uri sx's No chest pain, SOB, HA, dizziness, vision change, N/V, diarrhea, constipation, dysuria, urinary urgency or frequency, myalgias, arthralgias or rash.     Objective:   Physical Exam Vital signs noted  Well developed well nourished female.  HEENT - Head atraumatic Normocephalic                Eyes - PERRLA, Conjuctiva - clear Sclera- Clear EOMI                Ears - EAC's Wnl TM's Wnl Gross Hearing WNL                Throat - oropharanx wnl Respiratory - Lungs CTA bilateral Cardiac - RRR S1 and S2 without murmur        Assessment & Plan:  Acute bronchitis - Plan: benzonatate (TESSALON) 200 MG capsule, azithromycin (ZITHROMAX) 250 MG tablet Push po fluids, rest, tylenol and motrin otc.  Deatra Canter FNP

## 2013-07-11 ENCOUNTER — Telehealth: Payer: Self-pay | Admitting: Family Medicine

## 2013-07-11 NOTE — Telephone Encounter (Signed)
Pt is no better Continued cough and congestion Wants something else for congestion and cough

## 2013-07-12 ENCOUNTER — Other Ambulatory Visit: Payer: Self-pay | Admitting: Family Medicine

## 2013-07-12 MED ORDER — AMOXICILLIN 875 MG PO TABS: 875.0000 mg | ORAL_TABLET | Freq: Two times a day (BID) | ORAL | Status: DC

## 2013-07-12 NOTE — Telephone Encounter (Signed)
Amoxicillin sent to her pharmacy.

## 2013-07-14 NOTE — Telephone Encounter (Signed)
Pt aware.

## 2013-07-26 ENCOUNTER — Ambulatory Visit (INDEPENDENT_AMBULATORY_CARE_PROVIDER_SITE_OTHER): Payer: Managed Care, Other (non HMO) | Admitting: Family Medicine

## 2013-07-26 ENCOUNTER — Telehealth: Payer: Self-pay | Admitting: Family Medicine

## 2013-07-26 ENCOUNTER — Ambulatory Visit (INDEPENDENT_AMBULATORY_CARE_PROVIDER_SITE_OTHER): Payer: Managed Care, Other (non HMO)

## 2013-07-26 ENCOUNTER — Encounter: Payer: Self-pay | Admitting: Family Medicine

## 2013-07-26 VITALS — BP 118/76 | HR 75 | Temp 97.0°F | Ht 67.0 in | Wt 147.8 lb

## 2013-07-26 DIAGNOSIS — R5381 Other malaise: Secondary | ICD-10-CM

## 2013-07-26 DIAGNOSIS — R059 Cough, unspecified: Secondary | ICD-10-CM | POA: Insufficient documentation

## 2013-07-26 DIAGNOSIS — Z639 Problem related to primary support group, unspecified: Secondary | ICD-10-CM

## 2013-07-26 DIAGNOSIS — R05 Cough: Secondary | ICD-10-CM

## 2013-07-26 DIAGNOSIS — F439 Reaction to severe stress, unspecified: Secondary | ICD-10-CM

## 2013-07-26 NOTE — Telephone Encounter (Signed)
PT HAS TAKEN 2 ROUNDS OF ANTIBIOTICS STILL SOB AND FATIGUE APPT SCHEDULED

## 2013-07-26 NOTE — Progress Notes (Signed)
Patient ID: Krystal Mccoy, female   DOB: 05/11/1966, 47 y.o.   MRN: [id number] SUBJECTIVE: CC: Chief Complaint  Patient presents with  . Follow-up    states seen 3 weeks ago "viral " and no better states has finished zpak and completed amoxicilllin states no energy light headed SOB    HPI: As above. Not sleeping has to take care of grand children, lots of stress. Initially had a virus with fever. Just did not seem to have fully recovered to full strength  Past Medical History  Diagnosis Date  . Clotting disorder     DVT  . Allergy   . Asthma    No past surgical history on file. History   Social History  . Marital Status: Married    Spouse Name: N/A    Number of Children: N/A  . Years of Education: N/A   Occupational History  . Not on file.   Social History Main Topics  . Smoking status: Never Smoker   . Smokeless tobacco: Never Used  . Alcohol Use: No  . Drug Use: No  . Sexual Activity: Not on file   Other Topics Concern  . Not on file   Social History Narrative  . No narrative on file   Family History  Problem Relation Age of Onset  . Heart disease Mother   . Heart disease Father   . Cancer Father     cancer   Current Outpatient Prescriptions on File Prior to Visit  Medication Sig Dispense Refill  . amoxicillin (AMOXIL) 875 MG tablet Take 1 tablet (875 mg total) by mouth 2 (two) times daily.  20 tablet  0  . aspirin 325 MG tablet Take 325 mg by mouth daily.      Marland Kitchen azithromycin (ZITHROMAX) 250 MG tablet Take 2 po first day and then one po qd x 4 days  6 tablet  0  . benzonatate (TESSALON) 200 MG capsule Take 1 capsule (200 mg total) by mouth 2 (two) times daily as needed for cough.  20 capsule  0  . ketotifen (ZADITOR) 0.025 % ophthalmic solution 1 drop 2 (two) times daily.      Marland Kitchen levocetirizine (XYZAL) 5 MG tablet       . meclizine (ANTIVERT) 25 MG tablet Take 1 tablet (25 mg total) by mouth 3 (three) times daily as needed.  30 tablet  0  . omeprazole  (PRILOSEC) 20 MG capsule       . PROAIR HFA 108 (90 BASE) MCG/ACT inhaler       . QNASL 80 MCG/ACT AERS        No current facility-administered medications on file prior to visit.   Allergies  Allergen Reactions  . Clarithromycin Swelling    Tongue swelling    There is no immunization history on file for this patient. Prior to Admission medications   Medication Sig Start Date End Date Taking? Authorizing Provider  amoxicillin (AMOXIL) 875 MG tablet Take 1 tablet (875 mg total) by mouth 2 (two) times daily. 07/12/13   Deatra Canter, FNP  aspirin 325 MG tablet Take 325 mg by mouth daily.    Historical Provider, MD  azithromycin (ZITHROMAX) 250 MG tablet Take 2 po first day and then one po qd x 4 days 07/04/13   Deatra Canter, FNP  benzonatate (TESSALON) 200 MG capsule Take 1 capsule (200 mg total) by mouth 2 (two) times daily as needed for cough. 07/04/13   Deatra Canter, FNP  ketotifen (ZADITOR) 0.025 % ophthalmic solution 1 drop 2 (two) times daily.    Historical Provider, MD  levocetirizine (XYZAL) 5 MG tablet  02/07/13   Historical Provider, MD  meclizine (ANTIVERT) 25 MG tablet Take 1 tablet (25 mg total) by mouth 3 (three) times daily as needed. 02/22/13   Doree Albee, MD  omeprazole (PRILOSEC) 20 MG capsule  02/07/13   Historical Provider, MD  PROAIR HFA 108 (90 BASE) MCG/ACT inhaler  02/07/13   Historical Provider, MD  QNASL 80 MCG/ACT AERS  02/07/13   Historical Provider, MD     ROS: As above in the HPI. All other systems are stable or negative.  OBJECTIVE: APPEARANCE:  Patient in no acute distress.The patient appeared well nourished and normally developed. Acyanotic. Waist: VITAL SIGNS:BP 118/76  Pulse 75  Temp(Src) 97 F (36.1 C) (Oral)  Ht 5\' 7"  (1.702 m)  Wt 147 lb 12.8 oz (67.042 kg)  BMI 23.14 kg/m2  SpO2 98% WF  SKIN: warm and  Dry without overt rashes, tattoos and scars  HEAD and Neck: without JVD, Head and scalp: normal Eyes:No scleral icterus. Fundi  normal, eye movements normal. Ears: Auricle normal, canal normal, Tympanic membranes normal, insufflation normal. Nose: normal Throat: normal Neck & thyroid: normal  CHEST & LUNGS: Chest wall: normal Lungs: Clear  CVS: Reveals the PMI to be normally located. Regular rhythm, First and Second Heart sounds are normal,  absence of murmurs, rubs or gallops. Peripheral vasculature: Radial pulses: normal Dorsal pedis pulses: normal Posterior pulses: normal  ABDOMEN:  Appearance: normal Benign, no organomegaly, no masses, no Abdominal Aortic enlargement. No Guarding , no rebound. No Bruits. Bowel sounds: normal  RECTAL: N/A GU: N/A  EXTREMETIES: nonedematous.  MUSCULOSKELETAL:  Spine: normal Joints: intact  NEUROLOGIC: oriented to time,place and person; nonfocal. Strength is normal Sensory is normal Reflexes are normal Cranial Nerves are normal.  ASSESSMENT: Cough - Plan: DG Chest 2 View, EKG 12-Lead, PR BREATHING CAPACITY TEST  Other malaise and fatigue  Stress at home  PLAN:  Orders Placed This Encounter  Procedures  . DG Chest 2 View    Standing Status: Future     Number of Occurrences: 1     Standing Expiration Date: 09/26/2014    Order Specific Question:  Reason for Exam (SYMPTOM  OR DIAGNOSIS REQUIRED)    Answer:  cough    Order Specific Question:  Is the patient pregnant?    Answer:  No    Order Specific Question:  Preferred imaging location?    Answer:  Internal  . EKG 12-Lead  . PR BREATHING CAPACITY TEST  [facility] reading (PRIMARY) by  Dr. Modesto Charon: hyperinflation. No acute findings.  PFTs, normal  EKG normal                            Stress reduction. counselled on stress management. Consider ssri if not better.  No orders of the defined types were placed in this encounter.   There are no discontinued medications. Return if symptoms worsen or fail to improve.  Toussaint Golson P. Modesto Charon, M.D.

## 2013-09-16 ENCOUNTER — Encounter: Payer: Self-pay | Admitting: Family Medicine

## 2013-09-16 ENCOUNTER — Ambulatory Visit (INDEPENDENT_AMBULATORY_CARE_PROVIDER_SITE_OTHER): Payer: Managed Care, Other (non HMO) | Admitting: Family Medicine

## 2013-09-16 VITALS — BP 142/79 | HR 84 | Temp 96.7°F | Ht 67.0 in | Wt 147.2 lb

## 2013-09-16 DIAGNOSIS — IMO0002 Reserved for concepts with insufficient information to code with codable children: Secondary | ICD-10-CM

## 2013-09-16 DIAGNOSIS — R350 Frequency of micturition: Secondary | ICD-10-CM

## 2013-09-16 DIAGNOSIS — N8111 Cystocele, midline: Secondary | ICD-10-CM

## 2013-09-16 LAB — POCT URINALYSIS DIPSTICK
Bilirubin, UA: NEGATIVE
Blood, UA: NEGATIVE
Glucose, UA: NEGATIVE
Ketones, UA: NEGATIVE
Leukocytes, UA: NEGATIVE
Nitrite, UA: NEGATIVE
Protein, UA: NEGATIVE
Spec Grav, UA: 1.01
Urobilinogen, UA: NEGATIVE
pH, UA: 5

## 2013-09-16 LAB — POCT UA - MICROSCOPIC ONLY
Bacteria, U Microscopic: NEGATIVE
Casts, Ur, LPF, POC: NEGATIVE
Crystals, Ur, HPF, POC: NEGATIVE
Mucus, UA: NEGATIVE
RBC, urine, microscopic: NEGATIVE
WBC, Ur, HPF, POC: NEGATIVE

## 2013-09-16 NOTE — Progress Notes (Signed)
   Subjective:    Patient ID: Krystal SleighMarie A Geidel, female    DOB: 10/28/1965, 48 y.o.   MRN: [id number]  HPI  This 48 y.o. female presents for evaluation of urinary frequency.  She states she was told by her Urologist a few years ago she would need surgery and that her bladder was tearing away and that She had cystocele.  She is unable to swallow pills.  She just finished amoxicillin elixir.  Review of Systems No chest pain, SOB, HA, dizziness, vision change, N/V, diarrhea, constipation, dysuria, urinary urgency or frequency, myalgias, arthralgias or rash.     Objective:   Physical Exam  Vital signs noted  Well developed well nourished female.  HEENT - Head atraumatic Normocephalic                Eyes - PERRLA, Conjuctiva - clear Sclera- Clear EOMI                Ears - EAC's Wnl TM's Wnl Gross Hearing WNL                Nose - Nares patent                 Throat - oropharanx wnl Respiratory - Lungs CTA bilateral Cardiac - RRR S1 and S2 without murmur       Assessment & Plan:  Urinary frequency - Plan: POCT UA - Microscopic Only, POCT urinalysis dipstick, Ambulatory referral to Urology  Cystocele - Plan: Ambulatory referral to Urology  Vaginal Candidiasis - Recommend otc monostat cream  Deatra CanterWilliam J Teara Duerksen FNP

## 2013-09-23 ENCOUNTER — Telehealth: Payer: Self-pay | Admitting: Family Medicine

## 2013-09-30 ENCOUNTER — Encounter: Payer: Self-pay | Admitting: Family Medicine

## 2013-09-30 ENCOUNTER — Ambulatory Visit (INDEPENDENT_AMBULATORY_CARE_PROVIDER_SITE_OTHER): Payer: Managed Care, Other (non HMO) | Admitting: Family Medicine

## 2013-09-30 VITALS — BP 112/74 | HR 77 | Temp 97.1°F | Ht 67.0 in | Wt 147.4 lb

## 2013-09-30 DIAGNOSIS — H60399 Other infective otitis externa, unspecified ear: Secondary | ICD-10-CM

## 2013-09-30 DIAGNOSIS — G44209 Tension-type headache, unspecified, not intractable: Secondary | ICD-10-CM

## 2013-09-30 DIAGNOSIS — F4541 Pain disorder exclusively related to psychological factors: Secondary | ICD-10-CM

## 2013-09-30 MED ORDER — NEOMYCIN-POLYMYXIN-HC 3.5-10000-1 OT SOLN: 3.0000 [drp] | Freq: Four times a day (QID) | OTIC | Status: DC

## 2013-09-30 MED ORDER — NAPROXEN 500 MG PO TABS: 500.0000 mg | ORAL_TABLET | Freq: Two times a day (BID) | ORAL | Status: DC

## 2013-09-30 NOTE — Progress Notes (Signed)
   Subjective:    Patient ID: Krystal Mccoy, female    DOB: 11/25/1965, 48 y.o.   MRN: [id number]  HPI  This 48 y.o. female presents for evaluation of severe headaches, Uri sx's , and right Ear pain for 2 days.  Review of Systems    No chest pain, SOB, HA, dizziness, vision change, N/V, diarrhea, constipation, dysuria, urinary urgency or frequency, myalgias, arthralgias or rash.  Objective:   Physical Exam  Vital signs noted  Well developed well nourished female.  HEENT - Head atraumatic Normocephalic                Eyes - PERRLA, Conjuctiva - clear Sclera- Clear EOMI                Ears - EAC right ear with decreased lumen and tenderness                Throat - oropharanx wnl Respiratory - Lungs CTA bilateral Cardiac - RRR S1 and S2 without murmur GI - Abdomen soft Nontender and bowel sounds active x 4 Extremities - No edema. Neuro - Grossly intact      Assessment & Plan:  Stress headaches - Plan: naproxen (NAPROSYN) 500 MG tablet  Otitis, externa, infective - Plan: neomycin-polymyxin-hydrocortisone (CORTISPORIN) otic solution  Deatra CanterWilliam J Artis Buechele FNP

## 2013-10-24 ENCOUNTER — Ambulatory Visit: Payer: Managed Care, Other (non HMO) | Admitting: Physical Therapy

## 2013-11-14 ENCOUNTER — Ambulatory Visit: Payer: Managed Care, Other (non HMO) | Attending: Urology | Admitting: Physical Therapy

## 2013-11-14 DIAGNOSIS — M629 Disorder of muscle, unspecified: Secondary | ICD-10-CM | POA: Insufficient documentation

## 2013-11-14 DIAGNOSIS — N8111 Cystocele, midline: Secondary | ICD-10-CM | POA: Insufficient documentation

## 2013-11-14 DIAGNOSIS — M2420 Disorder of ligament, unspecified site: Secondary | ICD-10-CM | POA: Insufficient documentation

## 2013-11-14 DIAGNOSIS — IMO0001 Reserved for inherently not codable concepts without codable children: Secondary | ICD-10-CM | POA: Insufficient documentation

## 2013-12-05 ENCOUNTER — Ambulatory Visit: Payer: Managed Care, Other (non HMO) | Attending: Urology | Admitting: Physical Therapy

## 2013-12-05 DIAGNOSIS — N8111 Cystocele, midline: Secondary | ICD-10-CM | POA: Insufficient documentation

## 2013-12-05 DIAGNOSIS — M629 Disorder of muscle, unspecified: Secondary | ICD-10-CM | POA: Insufficient documentation

## 2013-12-05 DIAGNOSIS — IMO0001 Reserved for inherently not codable concepts without codable children: Secondary | ICD-10-CM | POA: Insufficient documentation

## 2013-12-05 DIAGNOSIS — M2420 Disorder of ligament, unspecified site: Secondary | ICD-10-CM | POA: Insufficient documentation

## 2014-01-02 ENCOUNTER — Ambulatory Visit: Payer: Managed Care, Other (non HMO) | Admitting: Physical Therapy

## 2014-02-27 ENCOUNTER — Ambulatory Visit: Payer: Managed Care, Other (non HMO) | Admitting: Physical Therapy

## 2014-03-07 ENCOUNTER — Other Ambulatory Visit: Payer: Self-pay | Admitting: *Deleted

## 2014-03-07 MED ORDER — ACYCLOVIR 5 % EX OINT: TOPICAL_OINTMENT | CUTANEOUS | Status: DC

## 2014-03-07 NOTE — Telephone Encounter (Signed)
This med was not listed in Epic. Last ov 2/15.

## 2016-03-19 ENCOUNTER — Other Ambulatory Visit (HOSPITAL_COMMUNITY): Payer: Self-pay | Admitting: Gastroenterology

## 2016-03-19 DIAGNOSIS — R1310 Dysphagia, unspecified: Secondary | ICD-10-CM

## 2016-04-14 ENCOUNTER — Ambulatory Visit (HOSPITAL_COMMUNITY)
Admission: RE | Admit: 2016-04-14 | Discharge: 2016-04-14 | Disposition: A | Discharge status: Home / Self Care | Payer: Managed Care, Other (non HMO) | Source: Ambulatory Visit | Attending: Gastroenterology | Admitting: Gastroenterology

## 2016-04-14 DIAGNOSIS — R1310 Dysphagia, unspecified: Secondary | ICD-10-CM

## 2016-04-25 ENCOUNTER — Encounter: Payer: Self-pay | Admitting: Vascular Surgery

## 2016-04-30 ENCOUNTER — Encounter: Payer: Self-pay | Admitting: Vascular Surgery

## 2016-04-30 ENCOUNTER — Ambulatory Visit (INDEPENDENT_AMBULATORY_CARE_PROVIDER_SITE_OTHER): Payer: Managed Care, Other (non HMO) | Admitting: Vascular Surgery

## 2016-04-30 VITALS — BP 126/80 | HR 76 | Temp 97.0°F | Resp 16 | Ht 67.0 in | Wt 147.0 lb

## 2016-04-30 DIAGNOSIS — I871 Compression of vein: Secondary | ICD-10-CM | POA: Diagnosis not present

## 2016-04-30 NOTE — Progress Notes (Signed)
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 (09/19/1965) female who presents with cc: shortness of breath.  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  She underwent a CTA chest for r/o PE.  This CTA demonstrated no PE but suggest R SCV stenosis.  The patient notes no mild R arm swelling since a fall onto her R arm a few years ago that resulted in a stress fx of the right arm.  The swelling is usually minimal and is not lifestyle limiting.  She denies any other sx of SVC syndrome.  The patient had her RIJ thrombosis in the setting of B tubal ligation 14 years ago.  She was told her clot was due to her post-surgical status and she has not been on anticoagulation.   Past Medical History:  Diagnosis Date  . Allergy   . Asthma   . Clotting disorder (HCC)    DVT   Past Surgical History: B tubal ligation  Social History   Social History  . Marital status: Married    Spouse name: N/A  . Number of children: N/A  . Years of education: N/A   Occupational History  . Not on file.   Social History Main Topics  . Smoking status: Never Smoker  . Smokeless tobacco: Never Used  . Alcohol use No  . Drug use: No  . Sexual activity: Not on file   Other Topics Concern  . Not on file   Social History Narrative  . No narrative on file    Family History  Problem Relation Age of Onset  . Heart disease Mother   . Heart disease Father   . Cancer Father     cancer    Current Outpatient Prescriptions  Medication Sig Dispense Refill  . aspirin 325 MG tablet Take 325 mg by mouth daily.    Marland Kitchen omeprazole (PRILOSEC) 20 MG capsule     . acyclovir ointment (ZOVIRAX) 5 % Apply topically every three hours (Patient not taking: Reported on 04/30/2016) 15 g 2  . ketotifen (ZADITOR) 0.025 % ophthalmic solution 1 drop 2  (two) times daily.    Marland Kitchen levocetirizine (XYZAL) 5 MG tablet     . naproxen (NAPROSYN) 500 MG tablet Take 1 tablet (500 mg total) by mouth 2 (two) times daily with a meal. (Patient not taking: Reported on 04/30/2016) 30 tablet 0  . neomycin-polymyxin-hydrocortisone (CORTISPORIN) otic solution Place 3 drops into the right ear 4 (four) times daily. (Patient not taking: Reported on 04/30/2016) 10 mL 0  . PROAIR HFA 108 (90 BASE) MCG/ACT inhaler      No current facility-administered medications for this visit.     Allergies  Allergen Reactions  . Clarithromycin Swelling    Tongue swelling     REVIEW OF SYSTEMS:   Cardiac:  positive for: no symptoms, negative for: Chest pain or chest pressure, Shortness of breath upon exertion and Shortness of breath when lying flat,   Vascular:  positive for: no symptoms,  negative for: Pain in calf, thigh, or hip brought on by ambulation, Pain in feet at night that wakes you up from your sleep, Blood clot in your veins and Leg swelling  Pulmonary:  positive for: no symptoms,  negative for: Oxygen at home, Productive cough and Wheezing  Neurologic:  positive for: Problems with dizziness, negative for: Sudden weakness in arms or legs, Sudden numbness in arms or legs, Sudden onset of difficulty speaking or slurred speech, Temporary loss of vision in one eye and Problems with dizziness  Gastrointestinal:  positive for: no symptoms, negative for: Blood in stool and Vomited blood  Genitourinary:  positive for: no symptoms, negative for: Burning when urinating and Blood in urine  Psychiatric:  positive for: no symptoms,  negative for: Major depression  Hematologic:  positive for: no symptoms,  negative for: negative for: Bleeding problems and Problems with blood clotting too easily  Dermatologic:  positive for: no symptoms, negative for: Rashes or ulcers  Constitutional:  positive for: no symptoms, negative for: Fever or chills   Physical  Examination  Vitals:   04/30/16 0938 04/30/16 0940  BP: 132/62 126/80  Pulse: 76 76  Resp: 16   Temp: 97 F (36.1 C)   SpO2: 100%   Weight: 147 lb (66.7 kg)   Height: 5\' 7"  (1.702 m)     Body mass index is 23.02 kg/m.  General: Alert, O x 3, WD,NAD  Head: [place]/AT,   Ear/Nose/Throat: Hearing grossly intact, nares without erythema or drainage, oropharynx without Erythema or Exudate , Mallampati score: 3, Dentition intact  Eyes: PERRLA, EOMI,   Neck: Supple, mid-line trachea,   no neck vein distension, no posterior neck veins evident  Pulmonary: Sym exp, good B air movt,CTA B, no significant superificial chest wall venous distension  Cardiac: RRR, Nl S1, S2, no Murmurs, No rubs, No S3,S4  Vascular: Vessel Right Left  Radial Palpable Palpable  Brachial Palpable Palpable  Carotid Palpable, No Bruit Palpable, No Bruit  Aorta Not palpable N/A  Femoral Palpable Palpable  Popliteal Not palpable Not palpable  PT Palpable Palpable  DP Palpable Palpable   Gastrointestinal: soft, non-distended, non-tender to palpation, No guarding or rebound, no HSM, no masses, no CVAT B, No palpable prominent aortic pulse,    Musculoskeletal: M/S 5/5 throughout  , Extremities without ischemic changes  , No Edema in legs, mild RUE edema 1+, LUE without edema,  , No LDS present  Neurologic: CN 2-12 intact , Pain and light touch intact in extremities , Motor exam as listed above  Psychiatric: Judgement intact, Mood & affect appropriate for pt's clinical situation  Dermatologic: See M/S exam for extremity exam, No rashes otherwise noted  Lymph : Palpable lymph nodes: None   Outside CTA chest (03/31/16)  No evidence of PE  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   Outside Studies/Documentation 6 pages of outside documents  were reviewed including: outpatient CTA, PCP chart.   Medical Decision Making  Krystal Mccoy is a 50 y.o. 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  Clinical history would be consistent with R SCV stenosis, but physical examination is underwhelming.  Given the patient's near lack of sx, it is hard to justify the cost and risk of R central venography.  The patient also is not interested in considering a R 1st rib  resection if needed, given her lack of sx.  SCV occlusion is a common occurrence in dialysis patient and in patients with permanent pacemakers wire in the SCV.  The natural history of these patient is development of venous collaterals on the chest and neck without significant sx.  I don't think this patient's findings and sx support a diagnosis of SVC syndrome, so again, further venography is not essential at this point.  We discussed returning to the office if her swelling worsens.  Thank you for allowing us to participate in this patient's care.   Leonides SakeBrian Dessa Ledee, MD, FACS [facility] Office: [phone] Pager: [phone]  04/30/2016, 10:05 AM

## 2016-05-02 ENCOUNTER — Encounter: Payer: Self-pay | Admitting: Radiology

## 2017-03-17 ENCOUNTER — Other Ambulatory Visit: Payer: Self-pay | Admitting: Family

## 2017-03-17 DIAGNOSIS — D649 Anemia, unspecified: Secondary | ICD-10-CM

## 2017-03-18 ENCOUNTER — Other Ambulatory Visit (HOSPITAL_BASED_OUTPATIENT_CLINIC_OR_DEPARTMENT_OTHER): Payer: Managed Care, Other (non HMO)

## 2017-03-18 ENCOUNTER — Ambulatory Visit (HOSPITAL_BASED_OUTPATIENT_CLINIC_OR_DEPARTMENT_OTHER): Payer: Managed Care, Other (non HMO) | Admitting: Family

## 2017-03-18 VITALS — BP 125/67 | HR 69 | Temp 97.7°F | Resp 18 | Wt 136.0 lb

## 2017-03-18 DIAGNOSIS — D509 Iron deficiency anemia, unspecified: Secondary | ICD-10-CM

## 2017-03-18 DIAGNOSIS — D649 Anemia, unspecified: Secondary | ICD-10-CM

## 2017-03-18 DIAGNOSIS — D500 Iron deficiency anemia secondary to blood loss (chronic): Secondary | ICD-10-CM

## 2017-03-18 LAB — COMPREHENSIVE METABOLIC PANEL
ALBUMIN: 3.9 g/dL (ref 3.5–5.0)
ALK PHOS: 82 U/L (ref 40–150)
ALT: 17 U/L (ref 0–55)
AST: 19 U/L (ref 5–34)
Anion Gap: 8 mEq/L (ref 3–11)
BILIRUBIN TOTAL: 0.94 mg/dL (ref 0.20–1.20)
BUN: 5.8 mg/dL — AB (ref 7.0–26.0)
CALCIUM: 9 mg/dL (ref 8.4–10.4)
CO2: 25 mEq/L (ref 22–29)
CREATININE: 0.9 mg/dL (ref 0.6–1.1)
Chloride: 105 mEq/L (ref 98–109)
EGFR: 78 mL/min/{1.73_m2} — ABNORMAL LOW (ref >90)
GLUCOSE: 86 mg/dL (ref 70–140)
POTASSIUM: 4 meq/L (ref 3.5–5.1)
SODIUM: 138 meq/L (ref 136–145)
TOTAL PROTEIN: 7.2 g/dL (ref 6.4–8.3)

## 2017-03-18 LAB — CBC WITH DIFFERENTIAL (CANCER CENTER ONLY)
BASO#: 0.1 10*3/uL (ref 0.0–0.2)
BASO%: 1.1 % (ref 0.0–2.0)
EOS%: 1.9 % (ref 0.0–7.0)
Eosinophils Absolute: 0.1 10*3/uL (ref 0.0–0.5)
HEMATOCRIT: 29.9 % — AB (ref 34.8–46.6)
HEMOGLOBIN: 8.9 g/dL — AB (ref 11.6–15.9)
LYMPH#: 1.1 10*3/uL (ref 0.9–3.3)
LYMPH%: 23.4 % (ref 14.0–48.0)
MCH: 19.3 pg — ABNORMAL LOW (ref 26.0–34.0)
MCHC: 29.8 g/dL — ABNORMAL LOW (ref 32.0–36.0)
MCV: 65 fL — ABNORMAL LOW (ref 81–101)
MONO#: 0.5 10*3/uL (ref 0.1–0.9)
MONO%: 9.6 % (ref 0.0–13.0)
NEUT%: 64 % (ref 39.6–80.0)
NEUTROS ABS: 3 10*3/uL (ref 1.5–6.5)
Platelets: 439 10*3/uL — ABNORMAL HIGH (ref 145–400)
RBC: 4.61 10*6/uL (ref 3.70–5.32)
RDW: 20.3 % — ABNORMAL HIGH (ref 11.1–15.7)
WBC: 4.7 10*3/uL (ref 3.9–10.0)

## 2017-03-18 LAB — FERRITIN: Ferritin: 6 ng/ml — ABNORMAL LOW (ref 9–269)

## 2017-03-18 LAB — LACTATE DEHYDROGENASE: LDH: 79 U/L — ABNORMAL LOW (ref 125–245)

## 2017-03-18 LAB — IRON AND TIBC
%SAT: 4 % — ABNORMAL LOW (ref 21–57)
IRON: 14 ug/dL — AB (ref 41–142)
TIBC: 395 ug/dL (ref 236–444)
UIBC: 381 ug/dL (ref 120–384)

## 2017-03-18 LAB — CHCC SATELLITE - SMEAR

## 2017-03-18 NOTE — Progress Notes (Signed)
Hematology/Oncology Consultation   Name: Krystal Mccoy      MRN: [id number]    Location: Room/bed info not found  Date: 03/18/2017 Time:10:10 AM   REFERRING PHYSICIAN: Terri Piedraourtney Forcucci, PA  REASON FOR CONSULT: Iron deficiency anemia   DIAGNOSIS: Iron deficiency anemia   HISTORY OF PRESENT ILLNESS: Krystal Mccoy is a pleasant 51 yo caucasian female with history of iron deficiency anemia. She has tried taking an oral iron supplement with no improvement. Iron saturation is currently 4% with a ferritin of 6. Hgb is 8.9 with an MCV of 65.  She is symptomatic with fatigue, weakness, SOB with exertion and easy bruising.  She has regular heavy cycles with no clots. She has had no other episodes of bleeding, no petechiae.  No family history of anemia. No personal history of cancer. Her father passed away from metastatic kidney cancer.  She recently had some of her teeth pulled and is wearing temporary dentures. She had no excessive bleeding with the procedure. She has not been eating well due to the poor fitting dentures. She is staying well hydrated. Her weight is stable.  No fever, chills, n/v, cough, rash, dizziness, chest pain, palpitations, abdominal pain or changes in bowel or bladder habits.  No lymphadenopathy found on exam.  No swelling, tenderness, numbness or tingling in her extremities. No c/o pain at this time.  She was never a smoker, no ETOH.  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 Her youngest is [id number]. No history of miscarriage.  She states that she had her mammogram last month which was negative.  She does not want a colonoscopy and has not had one since turning 51 yo.   ROS: All other 10 point review of systems is negative.   PAST MEDICAL HISTORY:   Past Medical History:  Diagnosis Date  . Allergy   . Asthma   . Clotting disorder (HCC)    DVT    ALLERGIES: Allergies  Allergen Reactions  . Clarithromycin Swelling    Tongue swelling      MEDICATIONS:  Current  Outpatient Prescriptions on File Prior to Visit  Medication Sig Dispense Refill  . acyclovir ointment (ZOVIRAX) 5 % Apply topically every three hours (Patient not taking: Reported on 04/30/2016) 15 g 2  . aspirin 325 MG tablet Take 325 mg by mouth daily.    Marland Kitchen. ketotifen (ZADITOR) 0.025 % ophthalmic solution 1 drop 2 (two) times daily.    Marland Kitchen. levocetirizine (XYZAL) 5 MG tablet     . naproxen (NAPROSYN) 500 MG tablet Take 1 tablet (500 mg total) by mouth 2 (two) times daily with a meal. (Patient not taking: Reported on 04/30/2016) 30 tablet 0  . neomycin-polymyxin-hydrocortisone (CORTISPORIN) otic solution Place 3 drops into the right ear 4 (four) times daily. (Patient not taking: Reported on 04/30/2016) 10 mL 0  . omeprazole (PRILOSEC) 20 MG capsule     . PROAIR HFA 108 (90 BASE) MCG/ACT inhaler      No current facility-administered medications on file prior to visit.      PAST SURGICAL HISTORY No past surgical history on file.  FAMILY HISTORY: Family History  Problem Relation Age of Onset  . Heart disease Mother   . Heart disease Father   . Cancer Father        cancer    SOCIAL HISTORY:  reports that she has never smoked. She has never used smokeless tobacco. She reports that she does not drink alcohol or use drugs.  PERFORMANCE STATUS: The patient's performance status is 1 - Symptomatic but completely ambulatory  PHYSICAL EXAM: Most Recent Vital Signs: There were no vitals taken for this visit. BP 125/67 (BP Location: Left Arm, Patient Position: Sitting)   Pulse 69   Temp 97.7 F (36.5 C) (Oral)   Resp 18   Wt 136 lb (61.7 kg)   SpO2 100%   BMI 21.30 kg/m   General Appearance:    Alert, cooperative, no distress, appears stated age  Head:    Normocephalic, without obvious abnormality, atraumatic  Eyes:    PERRL, conjunctiva/corneas clear, EOM's intact, fundi    benign, both eyes        Throat:   Lips, mucosa, and tongue normal; teeth and gums normal  Neck:   Supple,  symmetrical, trachea midline, no adenopathy;    thyroid:  no enlargement/tenderness/nodules; no carotid   bruit or JVD  Back:     Symmetric, no curvature, ROM normal, no CVA tenderness  Lungs:     Clear to auscultation bilaterally, respirations unlabored  Chest Wall:    No tenderness or deformity   Heart:    Regular rate and rhythm, S1 and S2 normal, no murmur, rub   or gallop     Abdomen:     Soft, non-tender, bowel sounds active all four quadrants,    no masses, no organomegaly        Extremities:   Extremities normal, atraumatic, no cyanosis or edema  Pulses:   2+ and symmetric all extremities  Skin:   Skin color, texture, turgor normal, no rashes or lesions  Lymph nodes:   Cervical, supraclavicular, and axillary nodes normal       LABORATORY DATA:  Results for orders placed or performed in visit on 03/18/17 (from the past 48 hour(s))  CBC w/Diff     Status: Abnormal   Collection Time: 03/18/17  9:57 AM  Result Value Ref Range   WBC 4.7 3.9 - 10.0 10e3/uL   RBC 4.61 3.70 - 5.32 10e6/uL   HGB 8.9 (L) 11.6 - 15.9 g/dL   HCT 95.6 (L) 21.3 - 08.6 %   MCV 65 (L) 81 - 101 fL   MCH 19.3 (L) 26.0 - 34.0 pg   MCHC 29.8 (L) 32.0 - 36.0 g/dL   RDW 57.8 (H) 46.9 - 62.9 %   Platelets 439 (H) 145 - 400 10e3/uL   NEUT# 3.0 1.5 - 6.5 10e3/uL   LYMPH# 1.1 0.9 - 3.3 10e3/uL   MONO# 0.5 0.1 - 0.9 10e3/uL   Eosinophils Absolute 0.1 0.0 - 0.5 10e3/uL   BASO# 0.1 0.0 - 0.2 10e3/uL   NEUT% 64.0 39.6 - 80.0 %   LYMPH% 23.4 14.0 - 48.0 %   MONO% 9.6 0.0 - 13.0 %   EOS% 1.9 0.0 - 7.0 %   BASO% 1.1 0.0 - 2.0 %      RADIOGRAPHY: No results found.     PATHOLOGY: None  ASSESSMENT/PLAN: Krystal Mccoy is a pleasant 51 yo caucasian female with history of iron deficiency anemia. She had no improvement with an oral iron saturation. She is symptomatic with fatigue, weakness and SOB with any exertion.  Her iron saturation was 4% with a ferritin of 6. Hgb is 8.9 with an MCV of 65.  She is unable to  stay for infusion today so we will plan to give her IV iron next week and a second dose a week later.  We will see her back again in another 6  weeks for repeat lab work and follow-up.   All questions were answered and she is in agreement with the plan. She will contact our office with any questions or concerns. We can certainly see her much sooner if necessary.  She was discussed with and also seen by Dr. Myna Hidalgo and he is in agreement with the aforementioned.   [facility] M     Addendum:   I saw and examined the patient with Krystal Mccoy. I agree with the above assessment.  Her iron studies show a ferritin of 6 with an iron saturation of only 4%. She clearly is iron deficient.  I looked at her blood under the microscope. Her blood smear looked consistent with iron deficiency. She had microcytic and hypochromic red blood cells. There were no nucleated red blood cells. I saw no teardrop cells. White blood cells and platelets looked adequate.  I am sure that IV iron will help. This will improve her symptoms of fatigue.  She might need 2 doses.  We will plan to get her back in about 6 weeks. We should be seeing a nice bump in her hemoglobin and a rise in her MCV.  We spent about 40 minutes with her.  We answered her questions. We reassured her that she should be able to feel much better with the IV iron.  Christin Bach, MD

## 2017-03-19 LAB — RETICULOCYTES: RETICULOCYTE COUNT: 0.8 % (ref 0.6–2.6)

## 2017-03-19 LAB — VITAMIN B12: Vitamin B12: 663 pg/mL (ref 232–1245)

## 2017-03-19 LAB — ERYTHROPOIETIN: Erythropoietin: 96.5 m[IU]/mL — ABNORMAL HIGH (ref 2.6–18.5)

## 2017-03-23 ENCOUNTER — Ambulatory Visit (HOSPITAL_BASED_OUTPATIENT_CLINIC_OR_DEPARTMENT_OTHER): Payer: Managed Care, Other (non HMO)

## 2017-03-23 ENCOUNTER — Other Ambulatory Visit: Payer: Self-pay | Admitting: Family

## 2017-03-23 DIAGNOSIS — D509 Iron deficiency anemia, unspecified: Secondary | ICD-10-CM

## 2017-03-23 DIAGNOSIS — D649 Anemia, unspecified: Secondary | ICD-10-CM

## 2017-03-23 DIAGNOSIS — D500 Iron deficiency anemia secondary to blood loss (chronic): Secondary | ICD-10-CM

## 2017-03-23 LAB — FECAL OCCULT BLOOD, GUIAC - CHCC SATELLITE: OCCULT BLOOD: NEGATIVE

## 2017-03-23 MED ORDER — SODIUM CHLORIDE 0.9 % IV SOLN
510.0000 mg | Freq: Once | INTRAVENOUS | Status: AC
  Administered 2017-03-23: 510 mg via INTRAVENOUS
  Filled 2017-03-23: qty 17

## 2017-03-23 MED ORDER — SODIUM CHLORIDE 0.9 % IV SOLN
Freq: Once | INTRAVENOUS | Status: AC
  Administered 2017-03-23: 15:00:00 via INTRAVENOUS

## 2017-03-23 NOTE — Patient Instructions (Signed)

## 2017-03-30 ENCOUNTER — Ambulatory Visit (HOSPITAL_BASED_OUTPATIENT_CLINIC_OR_DEPARTMENT_OTHER): Payer: Managed Care, Other (non HMO)

## 2017-03-30 VITALS — BP 125/63 | HR 64 | Temp 98.0°F | Resp 20

## 2017-03-30 DIAGNOSIS — D509 Iron deficiency anemia, unspecified: Secondary | ICD-10-CM | POA: Diagnosis not present

## 2017-03-30 DIAGNOSIS — D500 Iron deficiency anemia secondary to blood loss (chronic): Secondary | ICD-10-CM

## 2017-03-30 MED ORDER — SODIUM CHLORIDE 0.9 % IV SOLN
Freq: Once | INTRAVENOUS | Status: AC
  Administered 2017-03-30: 11:00:00 via INTRAVENOUS

## 2017-03-30 MED ORDER — SODIUM CHLORIDE 0.9 % IV SOLN
510.0000 mg | Freq: Once | INTRAVENOUS | Status: AC
  Administered 2017-03-30: 510 mg via INTRAVENOUS
  Filled 2017-03-30: qty 17

## 2017-03-30 NOTE — Patient Instructions (Signed)

## 2017-04-27 ENCOUNTER — Ambulatory Visit: Payer: Managed Care, Other (non HMO)

## 2017-04-27 ENCOUNTER — Other Ambulatory Visit (HOSPITAL_BASED_OUTPATIENT_CLINIC_OR_DEPARTMENT_OTHER): Payer: Managed Care, Other (non HMO)

## 2017-04-27 ENCOUNTER — Ambulatory Visit (HOSPITAL_BASED_OUTPATIENT_CLINIC_OR_DEPARTMENT_OTHER): Payer: Managed Care, Other (non HMO) | Admitting: Family

## 2017-04-27 VITALS — BP 126/78 | HR 67 | Temp 98.2°F | Resp 16 | Wt 137.0 lb

## 2017-04-27 DIAGNOSIS — D509 Iron deficiency anemia, unspecified: Secondary | ICD-10-CM

## 2017-04-27 DIAGNOSIS — D508 Other iron deficiency anemias: Secondary | ICD-10-CM

## 2017-04-27 DIAGNOSIS — D500 Iron deficiency anemia secondary to blood loss (chronic): Secondary | ICD-10-CM

## 2017-04-27 DIAGNOSIS — D649 Anemia, unspecified: Secondary | ICD-10-CM

## 2017-04-27 LAB — CBC WITH DIFFERENTIAL (CANCER CENTER ONLY)
BASO#: 0 10*3/uL (ref 0.0–0.2)
BASO%: 0.9 % (ref 0.0–2.0)
EOS%: 2.1 % (ref 0.0–7.0)
Eosinophils Absolute: 0.1 10*3/uL (ref 0.0–0.5)
HEMATOCRIT: 40.8 % (ref 34.8–46.6)
HEMOGLOBIN: 13.4 g/dL (ref 11.6–15.9)
LYMPH#: 1.4 10*3/uL (ref 0.9–3.3)
LYMPH%: 31.5 % (ref 14.0–48.0)
MCH: 26.1 pg (ref 26.0–34.0)
MCHC: 32.8 g/dL (ref 32.0–36.0)
MCV: 80 fL — AB (ref 81–101)
MONO#: 0.4 10*3/uL (ref 0.1–0.9)
MONO%: 9.8 % (ref 0.0–13.0)
NEUT#: 2.4 10*3/uL (ref 1.5–6.5)
NEUT%: 55.7 % (ref 39.6–80.0)
PLATELETS: 243 10*3/uL (ref 145–400)
RBC: 5.13 10*6/uL (ref 3.70–5.32)
WBC: 4.3 10*3/uL (ref 3.9–10.0)

## 2017-04-27 LAB — IRON AND TIBC
%SAT: 20 % — AB (ref 21–57)
Iron: 55 ug/dL (ref 41–142)
TIBC: 266 ug/dL (ref 236–444)
UIBC: 212 ug/dL (ref 120–384)

## 2017-04-27 LAB — RETICULOCYTES: RETICULOCYTE COUNT: 0.4 % — AB (ref 0.6–2.6)

## 2017-04-27 LAB — FERRITIN: Ferritin: 97 ng/ml (ref 9–269)

## 2017-04-27 LAB — TECHNOLOGIST REVIEW CHCC SATELLITE

## 2017-04-27 NOTE — Progress Notes (Signed)
Hematology and Oncology Follow Up Visit  Krystal Mccoy [id number] [birth year] 51 y.o. 04/27/2017   Principle Diagnosis:  Iron deficiency anemia   Current Therapy:   IV iron infusion as indicated - last received in August x 2   Interim History:  Krystal Mccoy is here today for follow-up. She received 2 doses of IV iron last month and has responded nicely. Hgb is now 13.4 with an MCV of 80.   She has had no episodes of bruising or petechiae. No fatigue. Her cycle is still heavy lasting 3-4 days.  No fever, chills, n/v, cough, rash, dizziness, SOB, chest pain, palpitations, abdominal pain or changes in bowel or bladder habits.  No swelling, tenderness, numbness or tingling in her extremities. No c/o pain.  Her appetite is still low. She has been having dental work done in preparation of getting dentures. She is staying well hydrated. Her weight is stable.   ECOG Performance Status: 0 - Asymptomatic  Medications:  Allergies as of 04/27/2017      Reactions   Clarithromycin Swelling   Tongue swelling      Medication List       Accurate as of 04/27/17 11:40 AM. Always use your most recent med list.          aspirin 325 MG tablet Take 325 mg by mouth daily.   omeprazole 20 MG capsule Commonly known as:  PRILOSEC   PROAIR HFA 108 (90 Base) MCG/ACT inhaler Generic drug:  albuterol       Allergies:  Allergies  Allergen Reactions  . Clarithromycin Swelling    Tongue swelling    Past Medical History, Surgical history, Social history, and Family History were reviewed and updated.  Review of Systems: All other 10 point review of systems is negative.   Physical Exam:  weight is 137 lb (62.1 kg). Her oral temperature is 98.2 F (36.8 C). Her blood pressure is 126/78 and her pulse is 67. Her respiration is 16 and oxygen saturation is 100%.   Wt Readings from Last 3 Encounters:  04/27/17 137 lb (62.1 kg)  03/18/17 136 lb (61.7 kg)  04/30/16 147 lb (66.7 kg)    Ocular:  Sclerae unicteric, pupils equal, round and reactive to light Ear-nose-throat: Oropharynx clear, dentition fair Lymphatic: No cervical, supraclavicular or axillary adenopathy Lungs no rales or rhonchi, good excursion bilaterally Heart regular rate and rhythm, no murmur appreciated Abd soft, nontender, positive bowel sounds, no liver or spleen tip palpated on exam, no fluid wave MSK no focal spinal tenderness, no joint edema Neuro: non-focal, well-oriented, appropriate affect Breasts: Deferred   Lab Results  Component Value Date   WBC 4.3 04/27/2017   HGB 13.4 04/27/2017   HCT 40.8 04/27/2017   MCV 80 (L) 04/27/2017   PLT 243 04/27/2017   Lab Results  Component Value Date   FERRITIN 6 (L) 03/18/2017   IRON 14 (L) 03/18/2017   TIBC 395 03/18/2017   UIBC 381 03/18/2017   IRONPCTSAT 4 (L) 03/18/2017   Lab Results  Component Value Date   RBC 5.13 04/27/2017   No results found for: KPAFRELGTCHN, LAMBDASER, KAPLAMBRATIO No results found for: IGGSERUM, IGA, IGMSERUM No results found for: Dorene Ar, A1GS, A2GS, Colin Benton, MSPIKE, SPEI   Chemistry      Component Value Date/Time   NA 138 03/18/2017 0957   K 4.0 03/18/2017 0957   CL 102 04/05/2013 1338   CO2 25 03/18/2017 0957   BUN 5.8 (L) 03/18/2017 0957  CREATININE 0.9 03/18/2017 0957      Component Value Date/Time   CALCIUM 9.0 03/18/2017 0957   ALKPHOS 82 03/18/2017 0957   AST 19 03/18/2017 0957   ALT 17 03/18/2017 0957   BILITOT 0.94 03/18/2017 0957      Impression and Plan: Krystal Mccoy is a very pleasant 14 caucasian female with iron deficiency anemia. She responded nicely to her iron infusions last month.  We will see what her iron studies show and bring her back in later this week for an infusion if needed.  We will go ahead and plan to see her back in another 2 months for repeat lab work and follow-up.  She will contact our office with any questions or concerns. We can certainly see her  sooner if need be.   Verdie Mosher, NP 9/24/201811:40 AM

## 2017-05-04 ENCOUNTER — Ambulatory Visit (HOSPITAL_BASED_OUTPATIENT_CLINIC_OR_DEPARTMENT_OTHER): Payer: Managed Care, Other (non HMO)

## 2017-05-04 VITALS — BP 125/69 | HR 60

## 2017-05-04 DIAGNOSIS — D509 Iron deficiency anemia, unspecified: Secondary | ICD-10-CM | POA: Diagnosis not present

## 2017-05-04 DIAGNOSIS — D500 Iron deficiency anemia secondary to blood loss (chronic): Secondary | ICD-10-CM

## 2017-05-04 MED ORDER — SODIUM CHLORIDE 0.9 % IV SOLN
510.0000 mg | Freq: Once | INTRAVENOUS | Status: AC
  Administered 2017-05-04: 510 mg via INTRAVENOUS
  Filled 2017-05-04: qty 17

## 2017-05-04 NOTE — Patient Instructions (Signed)

## 2017-06-22 ENCOUNTER — Encounter: Payer: Self-pay | Admitting: Family

## 2017-06-22 ENCOUNTER — Other Ambulatory Visit (HOSPITAL_BASED_OUTPATIENT_CLINIC_OR_DEPARTMENT_OTHER): Payer: Managed Care, Other (non HMO)

## 2017-06-22 ENCOUNTER — Ambulatory Visit (HOSPITAL_BASED_OUTPATIENT_CLINIC_OR_DEPARTMENT_OTHER): Payer: Managed Care, Other (non HMO) | Admitting: Family

## 2017-06-22 ENCOUNTER — Other Ambulatory Visit: Payer: Self-pay

## 2017-06-22 VITALS — BP 155/74 | HR 64 | Temp 97.8°F | Wt 136.0 lb

## 2017-06-22 DIAGNOSIS — N920 Excessive and frequent menstruation with regular cycle: Secondary | ICD-10-CM

## 2017-06-22 DIAGNOSIS — D500 Iron deficiency anemia secondary to blood loss (chronic): Secondary | ICD-10-CM

## 2017-06-22 DIAGNOSIS — D508 Other iron deficiency anemias: Secondary | ICD-10-CM

## 2017-06-22 LAB — RETICULOCYTES: Reticulocyte Count: 0.7 % (ref 0.6–2.6)

## 2017-06-22 LAB — CBC WITH DIFFERENTIAL (CANCER CENTER ONLY)
BASO#: 0.1 10*3/uL (ref 0.0–0.2)
BASO%: 0.9 % (ref 0.0–2.0)
EOS%: 1.5 % (ref 0.0–7.0)
Eosinophils Absolute: 0.1 10*3/uL (ref 0.0–0.5)
HCT: 42.3 % (ref 34.8–46.6)
HGB: 14.6 g/dL (ref 11.6–15.9)
LYMPH#: 1.7 10*3/uL (ref 0.9–3.3)
LYMPH%: 31.1 % (ref 14.0–48.0)
MCH: 30.5 pg (ref 26.0–34.0)
MCHC: 34.5 g/dL (ref 32.0–36.0)
MCV: 89 fL (ref 81–101)
MONO#: 0.5 10*3/uL (ref 0.1–0.9)
MONO%: 10.1 % (ref 0.0–13.0)
NEUT#: 3 10*3/uL (ref 1.5–6.5)
NEUT%: 56.4 % (ref 39.6–80.0)
PLATELETS: 241 10*3/uL (ref 145–400)
RBC: 4.78 10*6/uL (ref 3.70–5.32)
RDW: 16.5 % — AB (ref 11.1–15.7)
WBC: 5.3 10*3/uL (ref 3.9–10.0)

## 2017-06-22 LAB — FERRITIN: FERRITIN: 63 ng/mL (ref 9–269)

## 2017-06-22 LAB — IRON AND TIBC
%SAT: 36 % (ref 21–57)
IRON: 97 ug/dL (ref 41–142)
TIBC: 271 ug/dL (ref 236–444)
UIBC: 174 ug/dL (ref 120–384)

## 2017-06-22 NOTE — Progress Notes (Signed)
Hematology and Oncology Follow Up Visit  Krystal Mccoy [id number] 08/17/1965 51 y.o. 06/22/2017   Principle Diagnosis:  Iron deficiency anemia   Current Therapy:   IV iron infusion as indicated - last received in October 2018    Interim History:  Krystal Mccoy is here today for follow-up. She is feeling fatigued at times. She states that her cycle is still very heavy for the first 4 days and lasts a total of 7 days.  She has not been eating well due to not having her permanent teeth. She has her implants but cannot afford permanent dentures until the new year. She is hydrating well. Her weight is stable.  She has had some SOB with over exertion at times and few episodes of lightheadedness. She denies having had any falls or syncopal episodes.  She has some sinus congestion and drainage due to seasonal allergies.  No fever, chills, n/v, cough, rash, chest pain, palpitations, abdominal pain or changes in bowel or bladder habits.  No swelling, tenderness, numbness or tingling in her extremities. No c/o pain at this time.   ECOG Performance Status: 0 - Asymptomatic  Medications:  Allergies as of 06/22/2017      Reactions   Clarithromycin Swelling   Tongue swelling      Medication List        Accurate as of 06/22/17 11:39 AM. Always use your most recent med list.          aspirin 325 MG tablet Take 325 mg by mouth daily.   omeprazole 20 MG capsule Commonly known as:  PRILOSEC   PROAIR HFA 108 (90 Base) MCG/ACT inhaler Generic drug:  albuterol       Allergies:  Allergies  Allergen Reactions  . Clarithromycin Swelling    Tongue swelling    Past Medical History, Surgical history, Social history, and Family History were reviewed and updated.  Review of Systems: All other 10 point review of systems is negative.   Physical Exam:  weight is 136 lb (61.7 kg). Her oral temperature is 97.8 F (36.6 C). Her blood pressure is 155/74 (abnormal) and her pulse is 64. Her  oxygen saturation is 100%.   Wt Readings from Last 3 Encounters:  06/22/17 136 lb (61.7 kg)  04/27/17 137 lb (62.1 kg)  03/18/17 136 lb (61.7 kg)    Ocular: Sclerae unicteric, pupils equal, round and reactive to light Ear-nose-throat: Oropharynx clear, dentition fair Lymphatic: No cervical, supraclavicular or axillary adenopathy Lungs no rales or rhonchi, good excursion bilaterally Heart regular rate and rhythm, no murmur appreciated Abd soft, nontender, positive bowel sounds, no liver or spleen tip palpated on exam, no fluid wave  MSK no focal spinal tenderness, no joint edema Neuro: non-focal, well-oriented, appropriate affect Breasts: Deferred   Lab Results  Component Value Date   WBC 5.3 06/22/2017   HGB 14.6 06/22/2017   HCT 42.3 06/22/2017   MCV 89 06/22/2017   PLT 241 06/22/2017   Lab Results  Component Value Date   FERRITIN 97 04/27/2017   IRON 55 04/27/2017   TIBC 266 04/27/2017   UIBC 212 04/27/2017   IRONPCTSAT 20 (L) 04/27/2017   Lab Results  Component Value Date   RBC 4.78 06/22/2017   No results found for: KPAFRELGTCHN, LAMBDASER, KAPLAMBRATIO No results found for: IGGSERUM, IGA, IGMSERUM No results found for: TOTALPROTELP, ALBUMINELP, A1GS, A2GS, BETS, BETA2SER, GAMS, MSPIKE, SPEI   Chemistry      Component Value Date/Time   NA 138 03/18/2017 0957  K 4.0 03/18/2017 0957   CL 102 04/05/2013 1338   CO2 25 03/18/2017 0957   BUN 5.8 (L) 03/18/2017 0957   CREATININE 0.9 03/18/2017 0957      Component Value Date/Time   CALCIUM 9.0 03/18/2017 0957   ALKPHOS 82 03/18/2017 0957   AST 19 03/18/2017 0957   ALT 17 03/18/2017 0957   BILITOT 0.94 03/18/2017 0957      Impression and Plan: Krystal Mccoy Krystal Mccoy a very pleasant 51 yo caucasian female with iron deficiency anemia secondary to heavy cycles and poor diet. She is symptomatic with fatigue, SOB with exertion and lightheadedness.  Hgb is stable at 14.6 with an MCV of 89. We will see what her iron studies  show and bring her back in later next week for an infusion if needed.  We will go ahead and plan to see her back again in 3 months for follow-up and repeat lab work.  She will contact our office with any questions or concerns. We can certainly see her sooner if need be.   Verdie Mosher[place],Sevin Farone M, NP 11/19/201811:39 AM

## 2017-09-28 ENCOUNTER — Ambulatory Visit: Payer: Managed Care, Other (non HMO) | Admitting: Family

## 2017-09-28 ENCOUNTER — Other Ambulatory Visit: Payer: Managed Care, Other (non HMO)

## 2017-10-12 ENCOUNTER — Inpatient Hospital Stay (HOSPITAL_BASED_OUTPATIENT_CLINIC_OR_DEPARTMENT_OTHER): Payer: Managed Care, Other (non HMO) | Admitting: Family

## 2017-10-12 ENCOUNTER — Encounter: Payer: Self-pay | Admitting: Family

## 2017-10-12 ENCOUNTER — Inpatient Hospital Stay: Payer: Managed Care, Other (non HMO) | Attending: Hematology & Oncology

## 2017-10-12 ENCOUNTER — Other Ambulatory Visit: Payer: Self-pay

## 2017-10-12 VITALS — BP 128/61 | HR 70 | Temp 97.6°F | Resp 16 | Wt 139.0 lb

## 2017-10-12 DIAGNOSIS — R0602 Shortness of breath: Secondary | ICD-10-CM

## 2017-10-12 DIAGNOSIS — D500 Iron deficiency anemia secondary to blood loss (chronic): Secondary | ICD-10-CM | POA: Diagnosis not present

## 2017-10-12 DIAGNOSIS — Z79899 Other long term (current) drug therapy: Secondary | ICD-10-CM | POA: Diagnosis not present

## 2017-10-12 DIAGNOSIS — N920 Excessive and frequent menstruation with regular cycle: Secondary | ICD-10-CM | POA: Insufficient documentation

## 2017-10-12 DIAGNOSIS — R630 Anorexia: Secondary | ICD-10-CM

## 2017-10-12 DIAGNOSIS — R5383 Other fatigue: Secondary | ICD-10-CM

## 2017-10-12 LAB — CBC WITH DIFFERENTIAL (CANCER CENTER ONLY)
BASOS ABS: 0.1 10*3/uL (ref 0.0–0.1)
Basophils Relative: 1 %
EOS PCT: 1 %
Eosinophils Absolute: 0.1 10*3/uL (ref 0.0–0.5)
HEMATOCRIT: 41.5 % (ref 34.8–46.6)
Hemoglobin: 14.1 g/dL (ref 11.6–15.9)
LYMPHS ABS: 1.5 10*3/uL (ref 0.9–3.3)
LYMPHS PCT: 25 %
MCH: 31.4 pg (ref 26.0–34.0)
MCHC: 34 g/dL (ref 32.0–36.0)
MCV: 92.4 fL (ref 81.0–101.0)
Monocytes Absolute: 0.6 10*3/uL (ref 0.1–0.9)
Monocytes Relative: 10 %
NEUTROS ABS: 3.7 10*3/uL (ref 1.5–6.5)
Neutrophils Relative %: 63 %
PLATELETS: 259 10*3/uL (ref 145–400)
RBC: 4.49 MIL/uL (ref 3.70–5.32)
RDW: 11.7 % (ref 11.1–15.7)
WBC: 6 10*3/uL (ref 3.9–10.0)

## 2017-10-12 LAB — RETICULOCYTES
RBC.: 4.48 MIL/uL (ref 3.70–5.45)
Retic Count, Absolute: 31.4 10*3/uL — ABNORMAL LOW (ref 33.7–90.7)
Retic Ct Pct: 0.7 % (ref 0.7–2.1)

## 2017-10-12 NOTE — Progress Notes (Signed)
Hematology and Oncology Follow Up Visit  Krystal Mccoy [id number] 04/22/1966 52 y.o. 10/12/2017   Principle Diagnosis:  Iron deficiency anemia  Current Therapy:   IV iron infusion as indicated - last received in October 2018    Interim History: Krystal Mccoy is here today for follow-up. She is still having fatigue and SOB with exertion.  Her cycle is regular and still quite heavy. No other bleeding. No bruising or petechiae.  No fever, chills, n/v, cough, rash, dizziness, chest pain, palpitations, abdominal pain or changes in bowel or bladder habits.  No lymphadenopathy found on exam.  No swelling, tenderness, numbness or tingling in her extremities. No c/o pain.  She has maintained a good appetite and is staying well hydrated. Her weight is stable.   ECOG Performance Status: 1 - Symptomatic but completely ambulatory  Medications:  Allergies as of 10/12/2017      Reactions   Clarithromycin Swelling   Tongue swelling Tongue swelling   Prednisone Other (See Comments)   Amoxicillin Rash      Medication List        Accurate as of 10/12/17  1:27 PM. Always use your most recent med list.          aspirin 325 MG tablet Take 325 mg by mouth daily.   omeprazole 20 MG capsule Commonly known as:  PRILOSEC   PROAIR HFA 108 (90 Base) MCG/ACT inhaler Generic drug:  albuterol       Allergies:  Allergies  Allergen Reactions  . Clarithromycin Swelling    Tongue swelling Tongue swelling  . Prednisone Other (See Comments)  . Amoxicillin Rash    Past Medical History, Surgical history, Social history, and Family History were reviewed and updated.  Review of Systems: All other 10 point review of systems is negative.   Physical Exam:  vitals were not taken for this visit.   Wt Readings from Last 3 Encounters:  06/22/17 136 lb (61.7 kg)  04/27/17 137 lb (62.1 kg)  03/18/17 136 lb (61.7 kg)    Ocular: Sclerae unicteric, pupils equal, round and reactive to  light Ear-nose-throat: Oropharynx clear, dentition fair Lymphatic: No cervical, supraclavicular or axillary adenopathy Lungs no rales or rhonchi, good excursion bilaterally Heart regular rate and rhythm, no murmur appreciated Abd soft, nontender, positive bowel sounds, no liver or spleen tip palpated on exam, no fluid wave  MSK no focal spinal tenderness, no joint edema Neuro: non-focal, well-oriented, appropriate affect Breasts: Deferred   Lab Results  Component Value Date   WBC 6.0 10/12/2017   HGB 14.6 06/22/2017   HCT 41.5 10/12/2017   MCV 92.4 10/12/2017   PLT 259 10/12/2017   Lab Results  Component Value Date   FERRITIN 63 06/22/2017   IRON 97 06/22/2017   TIBC 271 06/22/2017   UIBC 174 06/22/2017   IRONPCTSAT 36 06/22/2017   Lab Results  Component Value Date   RBC 4.49 10/12/2017   No results found for: KPAFRELGTCHN, LAMBDASER, KAPLAMBRATIO No results found for: IGGSERUM, IGA, IGMSERUM No results found for: Dorene ArOTALPROTELP, ALBUMINELP, A1GS, A2GS, Karn PicklerBETS, BETA2SER, GAMS, MSPIKE, SPEI   Chemistry      Component Value Date/Time   NA 138 03/18/2017 0957   K 4.0 03/18/2017 0957   CL 102 04/05/2013 1338   CO2 25 03/18/2017 0957   BUN 5.8 (L) 03/18/2017 0957   CREATININE 0.9 03/18/2017 0957      Component Value Date/Time   CALCIUM 9.0 03/18/2017 0957   ALKPHOS 82 03/18/2017 0957  AST 19 03/18/2017 0957   ALT 17 03/18/2017 0957   BILITOT 0.94 03/18/2017 0957      Impression and Plan: Krystal Mccoy is a very pleasant 52 yo caucasian female with history of iron deficiency anemia secondary to heavy cycles and poor diet. She is still feeling fatigued and having SOB with exertion.  We will see what her iron studies show and bring her back in for infusion if needed.  We will go ahead and plan to see her back in another 4 months. She will contact our office with any questions or concerns. We can certainly see her sooner if need be.     Emeline Gins, NP 3/11/20191:27  PM

## 2017-10-13 ENCOUNTER — Other Ambulatory Visit: Payer: Self-pay | Admitting: Family

## 2017-10-13 DIAGNOSIS — E559 Vitamin D deficiency, unspecified: Secondary | ICD-10-CM

## 2017-10-13 DIAGNOSIS — D510 Vitamin B12 deficiency anemia due to intrinsic factor deficiency: Secondary | ICD-10-CM

## 2017-10-13 LAB — IRON AND TIBC
Iron: 62 ug/dL (ref 41–142)
SATURATION RATIOS: 19 % — AB (ref 21–57)
TIBC: 330 ug/dL (ref 236–444)
UIBC: 268 ug/dL

## 2017-10-13 LAB — FERRITIN: FERRITIN: 17 ng/mL (ref 9–269)

## 2017-10-19 ENCOUNTER — Other Ambulatory Visit: Payer: Self-pay

## 2017-10-19 ENCOUNTER — Inpatient Hospital Stay: Payer: Managed Care, Other (non HMO)

## 2017-10-19 VITALS — BP 130/67 | HR 61 | Temp 97.5°F | Resp 18

## 2017-10-19 DIAGNOSIS — D500 Iron deficiency anemia secondary to blood loss (chronic): Secondary | ICD-10-CM

## 2017-10-19 MED ORDER — SODIUM CHLORIDE 0.9 % IV SOLN
510.0000 mg | Freq: Once | INTRAVENOUS | Status: AC
  Administered 2017-10-19: 510 mg via INTRAVENOUS
  Filled 2017-10-19: qty 17

## 2017-10-19 NOTE — Patient Instructions (Signed)

## 2018-02-08 ENCOUNTER — Inpatient Hospital Stay: Payer: Managed Care, Other (non HMO) | Attending: Hematology & Oncology

## 2018-02-08 ENCOUNTER — Inpatient Hospital Stay (HOSPITAL_BASED_OUTPATIENT_CLINIC_OR_DEPARTMENT_OTHER): Payer: Managed Care, Other (non HMO) | Admitting: Family

## 2018-02-08 ENCOUNTER — Encounter: Payer: Self-pay | Admitting: Family

## 2018-02-08 ENCOUNTER — Other Ambulatory Visit: Payer: Self-pay

## 2018-02-08 VITALS — BP 114/62 | HR 70 | Temp 98.3°F | Resp 16 | Wt 139.0 lb

## 2018-02-08 DIAGNOSIS — E559 Vitamin D deficiency, unspecified: Secondary | ICD-10-CM

## 2018-02-08 DIAGNOSIS — N920 Excessive and frequent menstruation with regular cycle: Secondary | ICD-10-CM | POA: Diagnosis not present

## 2018-02-08 DIAGNOSIS — E538 Deficiency of other specified B group vitamins: Secondary | ICD-10-CM | POA: Diagnosis not present

## 2018-02-08 DIAGNOSIS — D510 Vitamin B12 deficiency anemia due to intrinsic factor deficiency: Secondary | ICD-10-CM

## 2018-02-08 DIAGNOSIS — R5383 Other fatigue: Secondary | ICD-10-CM | POA: Diagnosis not present

## 2018-02-08 DIAGNOSIS — D500 Iron deficiency anemia secondary to blood loss (chronic): Secondary | ICD-10-CM | POA: Insufficient documentation

## 2018-02-08 DIAGNOSIS — R51 Headache: Secondary | ICD-10-CM | POA: Insufficient documentation

## 2018-02-08 LAB — CBC WITH DIFFERENTIAL (CANCER CENTER ONLY)
Basophils Absolute: 0 10*3/uL (ref 0.0–0.1)
Basophils Relative: 1 %
Eosinophils Absolute: 0.2 10*3/uL (ref 0.0–0.5)
Eosinophils Relative: 2 %
HCT: 41.8 % (ref 34.8–46.6)
HEMOGLOBIN: 14.3 g/dL (ref 11.6–15.9)
LYMPHS ABS: 1.6 10*3/uL (ref 0.9–3.3)
LYMPHS PCT: 25 %
MCH: 31.6 pg (ref 26.0–34.0)
MCHC: 34.2 g/dL (ref 32.0–36.0)
MCV: 92.5 fL (ref 81.0–101.0)
MONO ABS: 0.6 10*3/uL (ref 0.1–0.9)
MONOS PCT: 9 %
NEUTROS ABS: 3.8 10*3/uL (ref 1.5–6.5)
Neutrophils Relative %: 63 %
Platelet Count: 270 10*3/uL (ref 145–400)
RBC: 4.52 MIL/uL (ref 3.70–5.32)
RDW: 12.3 % (ref 11.1–15.7)
WBC Count: 6.2 10*3/uL (ref 3.9–10.0)

## 2018-02-08 LAB — VITAMIN B12: Vitamin B-12: 492 pg/mL (ref 180–914)

## 2018-02-08 LAB — RETICULOCYTES
RBC.: 4.5 MIL/uL (ref 3.70–5.45)
Retic Count, Absolute: 31.5 10*3/uL — ABNORMAL LOW (ref 33.7–90.7)
Retic Ct Pct: 0.7 % (ref 0.7–2.1)

## 2018-02-08 NOTE — Progress Notes (Addendum)
Hematology and Oncology Follow Up Visit  Krystal Mccoy [id number] 09/19/1965 52 y.o. 02/08/2018   Principle Diagnosis:  Iron deficiency anemia  Current Therapy:   IV iron infusion as indicated - last received inMarch 2019   Interim History:  Krystal Mccoy is here today for follow-up. She is symptomatic with fatigue and weakness. She has intermittent headaches.  She told me today she has not seen a gynecologist in years and is still having heavy cycles. No other bleeding, no bruising or petechiae.  No falls of syncope.  No fever, chills, n/v, cough, rash, dizziness, SOB, chest pain, palpitations, abdominal pain or changes in bowel or bladder habits.  No swelling, tenderness, numbness or tingling in her extremities. No c/o pain.  No lymphadenopathy noted on exam.  She has a good appetite and is staying well hydrated. Her weight is stable.   ECOG Performance Status: 1 - Symptomatic but completely ambulatory  Medications:  Allergies as of 02/08/2018      Reactions   Clarithromycin Swelling   Tongue swelling Tongue swelling   Prednisone Other (See Comments)   Amoxicillin Rash      Medication List        Accurate as of 02/08/18  1:36 PM. Always use your most recent med list.          aspirin 325 MG tablet Take 325 mg by mouth daily.       Allergies:  Allergies  Allergen Reactions  . Clarithromycin Swelling    Tongue swelling Tongue swelling  . Prednisone Other (See Comments)  . Amoxicillin Rash    Past Medical History, Surgical history, Social history, and Family History were reviewed and updated.  Review of Systems: All other 10 point review of systems is negative.   Physical Exam:  vitals were not taken for this visit.   Wt Readings from Last 3 Encounters:  10/12/17 139 lb (63 kg)  06/22/17 136 lb (61.7 kg)  04/27/17 137 lb (62.1 kg)    Ocular: Sclerae unicteric, pupils equal, round and reactive to light Ear-nose-throat: Oropharynx clear, dentition  fair Lymphatic: No cervical, supraclavicular or axillary adenopathy Lungs no rales or rhonchi, good excursion bilaterally Heart regular rate and rhythm, no murmur appreciated Abd soft, nontender, positive bowel sounds, no liver or spleen tip palpated on exam, no fluid wave  MSK no focal spinal tenderness, no joint edema Neuro: non-focal, well-oriented, appropriate affect Breasts: Deferred   Lab Results  Component Value Date   WBC 6.2 02/08/2018   HGB 14.3 02/08/2018   HCT 41.8 02/08/2018   MCV 92.5 02/08/2018   PLT 270 02/08/2018   Lab Results  Component Value Date   FERRITIN 17 10/12/2017   IRON 62 10/12/2017   TIBC 330 10/12/2017   UIBC 268 10/12/2017   IRONPCTSAT 19 (L) 10/12/2017   Lab Results  Component Value Date   RETICCTPCT 0.7 10/12/2017   RBC 4.52 02/08/2018   No results found for: KPAFRELGTCHN, LAMBDASER, KAPLAMBRATIO No results found for: IGGSERUM, IGA, IGMSERUM No results found for: Dorene ArOTALPROTELP, ALBUMINELP, A1GS, A2GS, Colin BentonBETS, BETA2SER, GAMS, MSPIKE, SPEI   Chemistry      Component Value Date/Time   NA 138 03/18/2017 0957   K 4.0 03/18/2017 0957   CL 102 04/05/2013 1338   CO2 25 03/18/2017 0957   BUN 5.8 (L) 03/18/2017 0957   CREATININE 0.9 03/18/2017 0957      Component Value Date/Time   CALCIUM 9.0 03/18/2017 0957   ALKPHOS 82 03/18/2017 0957   AST 19  03/18/2017 0957   ALT 17 03/18/2017 0957   BILITOT 0.94 03/18/2017 0957      Impression and Plan: [name] is a very pleasant 52 yo caucasian female with iron deficiency secondary to heavy cycles. She is symptomatic at this time with fatigue, weakness and intermittent headaches.   We will see what her iron studies, Vit D and B 12 levels show and replace as needed.  Referral placed with gynecologist Dr. Jerene Bears.  We will go ahead and plan to see her back in another 3 months.  She will contact our office with any questions or concerns. We can certainly see her sooner if need be.   Emeline Gins, NP 7/8/20191:36 PM

## 2018-02-09 ENCOUNTER — Other Ambulatory Visit: Payer: Self-pay | Admitting: Family

## 2018-02-09 ENCOUNTER — Telehealth: Payer: Self-pay | Admitting: *Deleted

## 2018-02-09 DIAGNOSIS — E559 Vitamin D deficiency, unspecified: Secondary | ICD-10-CM

## 2018-02-09 LAB — FERRITIN: FERRITIN: 29 ng/mL (ref 11–307)

## 2018-02-09 LAB — IRON AND TIBC
Iron: 138 ug/dL (ref 41–142)
Saturation Ratios: 42 % (ref 21–57)
TIBC: 330 ug/dL (ref 236–444)
UIBC: 192 ug/dL

## 2018-02-09 LAB — VITAMIN D 25 HYDROXY (VIT D DEFICIENCY, FRACTURES): VIT D 25 HYDROXY: 26.3 ng/mL — AB (ref 30.0–100.0)

## 2018-02-09 MED ORDER — ERGOCALCIFEROL 1.25 MG (50000 UT) PO CAPS: 50000.0000 [IU] | ORAL_CAPSULE | ORAL | 6 refills | Status: DC

## 2018-02-09 NOTE — Telephone Encounter (Addendum)
Patient is aware of results. Aware of new prescription and pharmacy confirmed.   ----- Message from Verdie MosherSarah M [place], NP sent at 02/09/2018 10:50 AM EDT ----- Iron looks good but Vit D is low. [facility] units once a week supplement. Thank you!  Sarah  ----- Message ----- From: Leory PlowmanInterface, [facility] [place] Sent: 02/08/2018   1:36 PM To: Verdie MosherSarah M [place], NP

## 2018-03-15 ENCOUNTER — Ambulatory Visit: Payer: Managed Care, Other (non HMO) | Admitting: Obstetrics & Gynecology

## 2018-03-15 ENCOUNTER — Encounter

## 2018-04-12 ENCOUNTER — Other Ambulatory Visit: Payer: Self-pay

## 2018-04-12 ENCOUNTER — Other Ambulatory Visit: Payer: Self-pay | Admitting: Obstetrics & Gynecology

## 2018-04-12 ENCOUNTER — Other Ambulatory Visit (HOSPITAL_COMMUNITY)
Admission: RE | Admit: 2018-04-12 | Discharge: 2018-04-12 | Disposition: A | Discharge status: Home / Self Care | Payer: Managed Care, Other (non HMO) | Source: Ambulatory Visit | Attending: Obstetrics & Gynecology | Admitting: Obstetrics & Gynecology

## 2018-04-12 ENCOUNTER — Encounter: Payer: Self-pay | Admitting: Obstetrics & Gynecology

## 2018-04-12 ENCOUNTER — Ambulatory Visit: Payer: Managed Care, Other (non HMO) | Admitting: Obstetrics & Gynecology

## 2018-04-12 VITALS — BP 122/70 | HR 64 | Resp 14 | Ht 65.0 in | Wt 136.4 lb

## 2018-04-12 DIAGNOSIS — Z124 Encounter for screening for malignant neoplasm of cervix: Secondary | ICD-10-CM | POA: Diagnosis not present

## 2018-04-12 DIAGNOSIS — N852 Hypertrophy of uterus: Secondary | ICD-10-CM

## 2018-04-12 DIAGNOSIS — N920 Excessive and frequent menstruation with regular cycle: Secondary | ICD-10-CM | POA: Diagnosis not present

## 2018-04-12 DIAGNOSIS — D500 Iron deficiency anemia secondary to blood loss (chronic): Secondary | ICD-10-CM | POA: Diagnosis not present

## 2018-04-12 DIAGNOSIS — R5383 Other fatigue: Secondary | ICD-10-CM

## 2018-04-12 DIAGNOSIS — Z1231 Encounter for screening mammogram for malignant neoplasm of breast: Secondary | ICD-10-CM

## 2018-04-12 MED ORDER — TOLTERODINE TARTRATE ER 2 MG PO CP24: 2.0000 mg | ORAL_CAPSULE | Freq: Every day | ORAL | 1 refills | Status: DC

## 2018-04-12 NOTE — Progress Notes (Signed)
Patient scheduled for Pelvic ultrasound with transvaginal at [facility] on 04/19/18 with Follow up with Dr. Hyacinth Meeker on 04/26/18 and screening mammogram at breast center on 05/17/18.

## 2018-04-12 NOTE — Progress Notes (Addendum)
52 y.o. [id number] MarriedCaucasianF here for new patient annual exam.  She is referred from Sharen Heck due to menorrhagia.  She has regular cycles.  Four of her days are heavy that she describes as almost like she is "pouring blood".  Does not have clots. Uses maxi pads and has to change every few hours.  Does not have clotting.  About a week before her cycles, she has a lot of PMS symptoms.  Will have have cramping and diarrhea during this week.  Initially when she saw Maralyn Sago, her hemoglobin ws 8.9.  She's had four iron infusions in the last year.  Most recent hemoblogin 02/08/18 was 14.3 and last ferritin was 29.  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  She had a post partum BTL and the clot developed after the procedure.  Was on coumadin for a year afterwards and then transitioned to aspirin.  She is still on a 325mg  aspirin.  Did see Dr. Imogene Burn, vascular surgeon, 04/30/16 due to right sided pain and SOB.  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    Also repots some increased issues with urinary urgency and pressure.  Does leak urine with coughing or sneezing.  Has completley lost her urine at times.  Does not wear pads as she does not leak every day.  If she does a lot of physical activity, she will have to go void.  Urgency and frequency are the most annoying symptoms to her.  Denies dysuria.  Denies blood in her urine.  Lastly, she is absolutely not interested in any surgery--at all, no matter what.  Does not even want to discuss options that would involve surgery.  PCP:  [facility]  Patient's last menstrual period was 03/15/2018 (exact date).          Sexually active: Yes.    The current method of family planning is tubal ligation.    Exercising: No.   Smoker:  no  Health Maintenance: Pap:  Unsure  History of abnormal Pap:  no MMG:  10/05/12 BIRADS1:Neg  Colonoscopy:  Never BMD:    Never TDaP:  Unsure  Pneumonia vaccine(s):  n/a Shingrix:   No Hep C testing: n/a Screening Labs: PCP   reports that she has never smoked. She has never used smokeless tobacco. She reports that she does not drink alcohol or use drugs.  Past Medical History:  Diagnosis Date  . Allergy   . Asthma   . Clotting disorder (HCC)    DVT  . Iron deficiency anemia   . Menorrhagia with regular cycle   . Vitamin D deficiency     Past Surgical History:  Procedure Laterality Date  . TUBAL LIGATION      Current Outpatient Medications  Medication Sig Dispense Refill  . aspirin 325 MG tablet Take 325 mg by mouth daily.     No current facility-administered medications for this visit.     Family History  Problem Relation Age of Onset  . Heart disease Mother   . Heart disease Father   . Kidney cancer Father     Review of Systems  All other systems reviewed and are negative.   Exam:   BP 122/70 (BP Location: Right Arm, Patient Position: Sitting, Cuff Size: Normal)   Pulse 64   Resp 14   Ht 5\' 5"  (1.651 m)   Wt 136 lb 6.4 oz (  61.9 kg)   LMP 03/15/2018 (Exact Date)   BMI 22.70 kg/m    Height: 5\' 5"  (165.1 cm)  Ht Readings from Last 3 Encounters:  04/12/18 5\' 5"  (1.651 m)  04/30/16 5\' 7"  (1.702 m)  09/30/13 5\' 7"  (1.702 m)    General appearance: alert, cooperative and appears stated age Head: Normocephalic, without obvious abnormality, atraumatic Neck: no adenopathy, supple, symmetrical, trachea midline and thyroid normal to inspection and palpation Lungs: clear to auscultation bilaterally Breasts: normal appearance, no masses or tenderness Heart: regular rate and rhythm Abdomen: soft, non-tender; bowel sounds normal; no masses,  no organomegaly Extremities: extremities normal, atraumatic, no cyanosis or edema Skin: Skin color, texture, turgor normal. No rashes or lesions Lymph nodes: Cervical, supraclavicular, and axillary nodes normal. No abnormal inguinal nodes  palpated Neurologic: Grossly normal   Pelvic: External genitalia:  no lesions              Urethra:  normal appearing urethra with no masses, tenderness or lesions              Bartholins and Skenes: normal                 Vagina: normal appearing vagina with normal color and discharge, no lesions, second degree uterine prolapse with associated mild cystocele              Cervix: no lesions and parous              Pap taken: Yes.   Bimanual Exam:  Uterus:  enlarged, 12 weeks, feel full, no discrete mass weeks size              Adnexa: normal adnexa and no mass, fullness, tenderness               Rectovaginal: Confirms               Anus:  normal sphincter tone, no lesions  Chaperone was present for exam.  A:  Well Woman with normal exam Menorrhagia H/O iron deficiency anemia Second degree uterine prolapse Limited gynecologic care Enlarged uterus OAB  P:   Mammogram guidelines reviewed.  Scheduled for pt. Recommended she return for PUS to further evaluate her enlarged uterus.  She can only come on Mondays and she does not drive.  Will schedule this with follow-up OV on an upcoming Monday pap smear with HR HPV obtained Detrol LA 5mg  daily was started.  Side effects reviewed.  Pt knows to call with any unacceptable side effects.  #30/1RF. Colonoscopy recommended.  She does not want this at this time.  Will hopefully be able to convince her to proceed with cologuard.  return annually or prn  ~40 minutes spent with patient >50% of time was in face to face discussion of above.

## 2018-04-14 LAB — CYTOLOGY - PAP
Diagnosis: NEGATIVE
HPV (WINDOPATH): NOT DETECTED

## 2018-04-19 ENCOUNTER — Ambulatory Visit
Admission: RE | Admit: 2018-04-19 | Discharge: 2018-04-19 | Disposition: A | Discharge status: Home / Self Care | Payer: Managed Care, Other (non HMO) | Source: Ambulatory Visit | Attending: Obstetrics & Gynecology | Admitting: Obstetrics & Gynecology

## 2018-04-19 DIAGNOSIS — N852 Hypertrophy of uterus: Secondary | ICD-10-CM

## 2018-04-21 ENCOUNTER — Telehealth: Payer: Self-pay | Admitting: *Deleted

## 2018-04-21 DIAGNOSIS — N920 Excessive and frequent menstruation with regular cycle: Secondary | ICD-10-CM

## 2018-04-21 DIAGNOSIS — N852 Hypertrophy of uterus: Secondary | ICD-10-CM

## 2018-04-21 DIAGNOSIS — R9389 Abnormal findings on diagnostic imaging of other specified body structures: Secondary | ICD-10-CM

## 2018-04-21 NOTE — Telephone Encounter (Addendum)
Notes recorded by Leda MinHamm, Julien Oscar N, RN on 04/21/2018 at 10:59 AM EDT Spoke with patient, advised as seen below per Dr. Hyacinth MeekerMiller. Patient verbalizes understanding and is agreeable.  Patient has additional questions, see telephone encounter dated 04/21/18 to review with provider.   ----- Message from Jerene BearsMary S Miller, MD sent at 04/20/2018  1:08 PM EDT ----- Please let pt know her ultrasound showed her uterus to be enlarged just like I felt on exam.  No fibroids were present.  This means she probably has adenomyosis which is a benign cause of heavy bleeding.  Her endometrium was very thickened.  She does need an endometrial biopsy or a D&C.  She does not want surgery so I would recommend proceeding with biopsy.  Please schedule.  She has limited days she can come due to person who drives her to appointments.

## 2018-04-21 NOTE — Telephone Encounter (Signed)
Spoke with patient.   1. Declines D&C. Patient previously scheduled for PUS consult on 9/23 at 3:15pm with Dr. Hyacinth MeekerMiller, ok to keep OV as scheduled and do EMB at this OV?    2. Patient states she was advised to consider Mirena or Progesterone, does she need to make this decision prior to OV or wait for EMB results?   Order placed for EMB for precert.   Routing to Dr. Hyacinth MeekerMiller to review and advise.

## 2018-04-21 NOTE — Telephone Encounter (Signed)
Spoke with patient, advised as seen below per Dr. Hyacinth MeekerMiller. Advised to take Motrin 800 mg with food and water one hour before procedure. Patient verbalizes understanding and is agreeable. Encounter closed.   Routing to [employer]

## 2018-04-21 NOTE — Telephone Encounter (Signed)
Ok to do day of consultation.  Does not need to make any decisions until biopsy result is back.  Thanks.

## 2018-04-26 ENCOUNTER — Encounter: Payer: Self-pay | Admitting: Obstetrics & Gynecology

## 2018-04-26 ENCOUNTER — Ambulatory Visit: Payer: Managed Care, Other (non HMO) | Admitting: Obstetrics & Gynecology

## 2018-04-26 VITALS — BP 120/86 | HR 80 | Resp 18 | Ht 65.0 in | Wt 139.4 lb

## 2018-04-26 DIAGNOSIS — N852 Hypertrophy of uterus: Secondary | ICD-10-CM | POA: Diagnosis not present

## 2018-04-26 DIAGNOSIS — N920 Excessive and frequent menstruation with regular cycle: Secondary | ICD-10-CM

## 2018-04-26 DIAGNOSIS — R9389 Abnormal findings on diagnostic imaging of other specified body structures: Secondary | ICD-10-CM

## 2018-04-26 NOTE — Progress Notes (Signed)
GYNECOLOGY  VISIT  CC:   Review ultrasound and endometrial biopsy  HPI: 52 y.o. [id number] Married White or Caucasian female here for US results and for endometrial biopsy.  Pt can only have appointments on Mondays, so [facility], [facility]/19/18.  This showed enlarged uterus with heterogenous myometrium and endometrium of 2.5cm.  Ovaries were normal.  I've reviewed pictures and in some images, clear measurement of the endometrium is not clear so I'm not convinced her endometrium is 2.5cm.  However, I did recommend she return for endometrial biopsy.  Images reviewed.  Questions answered.  Consent for biopsy obtained.    GYNECOLOGIC HISTORY: Patient's last menstrual period was 04/21/2018 (exact date). Contraception: BTL Menopausal hormone therapy: none  Patient Active Problem List   Diagnosis Date Noted  . IDA (iron deficiency anemia) 03/18/2017  . 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 Cough 07/26/2013  . Back pain with radiation 12/15/2012  . 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 Seasonal allergic rhinitis 11/22/2012  . Fatigue 11/22/2012  . Plantar wart of right foot 11/22/2012  . Stress at home 11/22/2012    Past Medical History:  Diagnosis Date  . Allergy   . Asthma   . Clotting disorder (HCC)    DVT  . Iron deficiency anemia   . Menorrhagia with regular cycle   . Vitamin D deficiency     Past Surgical History:  Procedure Laterality Date  . TUBAL LIGATION      MEDS:   Current Outpatient Medications on File Prior to Visit  Medication Sig Dispense Refill  . aspirin 325 MG tablet Take 325 mg by mouth daily.    Marland Kitchen. tolterodine (DETROL LA) 2 MG 24 hr capsule Take 1 capsule (2 mg total) by mouth daily. 30 capsule 1   No current facility-administered medications on file prior to visit.     ALLERGIES: Clarithromycin; Prednisone; and Amoxicillin  Family History  Problem Relation Age of Onset  . Heart disease Mother    . Heart disease Father   . Kidney cancer Father     SH:  Married, non smoker  Review of Systems  All other systems reviewed and are negative.   PHYSICAL EXAMINATION:    BP 120/86 (BP Location: Left Arm, Patient Position: Sitting, Cuff Size: Large)   Pulse 80   Resp 18   Ht 5\' 5"  (1.651 m)   Wt 139 lb 6.4 oz (63.2 kg)   LMP 04/21/2018 (Exact Date)   BMI 23.20 kg/m     General appearance: alert, cooperative and appears stated age Lymph:  no inguinal LAD noted  Pelvic: External genitalia:  no lesions              Urethra:  normal appearing urethra with no masses, tenderness or lesions              Bartholins and Skenes: normal                 Vagina: normal appearing vagina with normal color and discharge, no lesions              Cervix: no lesions              Bimanual Exam:  Uterus:  normal size, contour, position, consistency, mobility, non-tender              Adnexa: no mass, fullness, tenderness  Endometrial biopsy recommended.  Discussed with patient.  Verbal and written  consent obtained.   Procedure:  Speculum placed.  Cervix visualized and cleansed with betadine prep.  A single toothed tenaculum was not applied to the anterior lip of the cervix.  Endometrial pipelle was advanced through the cervix into the endometrial cavity without difficulty.  Pipelle passed to 8cm.  Suction applied and pipelle removed with scant tissue sample obtained.  second pass was performed.  Tenculum removed.  No bleeding noted.  Patient tolerated procedure well.  Chaperone was present for exam.  Assessment: Enlarged uterus Likely adenomyosis 2.5cm endometrial thickness on ultrasound Menorrhagia  Plan: Endometrial biopsy obtained and sent to pathology.  Results will be called to pt and recommendations made at that point.  She adamantly does not want surgery.   ~10 minutes spent reviewing ultrasound and discussing options prior to proceeding with endometrial biopsy.

## 2018-04-29 ENCOUNTER — Telehealth: Payer: Self-pay | Admitting: Obstetrics & Gynecology

## 2018-04-29 NOTE — Telephone Encounter (Signed)
Patient calling for biopsy results

## 2018-04-29 NOTE — Telephone Encounter (Signed)
Spoke with patient. Advised EMB bx results dated 04/26/18 not back yet. Advised once results are back and Dr. Hyacinth Meeker has reviewed them, our office will return call. Advised this can take up to 7 days. Patient verbalizes understanding.  Routing to provider for final review. Patient is agreeable to disposition. Will close encounter.

## 2018-05-03 ENCOUNTER — Telehealth: Payer: Self-pay | Admitting: *Deleted

## 2018-05-03 NOTE — Telephone Encounter (Signed)
Called [facility] for EMB results from 04/26/18. Tenny Craw states Dr. [place] still working on specimen, has not released any results yet.

## 2018-05-04 NOTE — Telephone Encounter (Signed)
Krystal Mccoy from [facility] calling to advise 04/26/18 pathology results have been reviewed and released.   Confirmed results available in Epic, advised Dr. Hyacinth Meeker has reviewed.  Routing to [employer].  Encounter closed.

## 2018-05-10 ENCOUNTER — Inpatient Hospital Stay: Payer: Managed Care, Other (non HMO) | Attending: Hematology & Oncology

## 2018-05-10 ENCOUNTER — Ambulatory Visit: Payer: Managed Care, Other (non HMO) | Admitting: Obstetrics & Gynecology

## 2018-05-10 ENCOUNTER — Inpatient Hospital Stay (HOSPITAL_BASED_OUTPATIENT_CLINIC_OR_DEPARTMENT_OTHER): Payer: Managed Care, Other (non HMO) | Admitting: Family

## 2018-05-10 VITALS — BP 130/68 | HR 75 | Temp 97.6°F | Resp 16 | Wt 138.0 lb

## 2018-05-10 DIAGNOSIS — R7989 Other specified abnormal findings of blood chemistry: Secondary | ICD-10-CM

## 2018-05-10 DIAGNOSIS — D500 Iron deficiency anemia secondary to blood loss (chronic): Secondary | ICD-10-CM

## 2018-05-10 DIAGNOSIS — E538 Deficiency of other specified B group vitamins: Secondary | ICD-10-CM

## 2018-05-10 DIAGNOSIS — E559 Vitamin D deficiency, unspecified: Secondary | ICD-10-CM

## 2018-05-10 DIAGNOSIS — N920 Excessive and frequent menstruation with regular cycle: Secondary | ICD-10-CM

## 2018-05-10 LAB — CBC WITH DIFFERENTIAL (CANCER CENTER ONLY)
Basophils Absolute: 0 10*3/uL (ref 0.0–0.1)
Basophils Relative: 1 %
Eosinophils Absolute: 0.1 10*3/uL (ref 0.0–0.5)
Eosinophils Relative: 2 %
HEMATOCRIT: 39.5 % (ref 34.8–46.6)
HEMOGLOBIN: 12.9 g/dL (ref 11.6–15.9)
LYMPHS ABS: 1.4 10*3/uL (ref 0.9–3.3)
Lymphocytes Relative: 32 %
MCH: 29.5 pg (ref 26.0–34.0)
MCHC: 32.7 g/dL (ref 32.0–36.0)
MCV: 90.2 fL (ref 81.0–101.0)
MONOS PCT: 12 %
Monocytes Absolute: 0.5 10*3/uL (ref 0.1–0.9)
NEUTROS PCT: 53 %
Neutro Abs: 2.4 10*3/uL (ref 1.5–6.5)
Platelet Count: 259 10*3/uL (ref 145–400)
RBC: 4.38 MIL/uL (ref 3.70–5.32)
RDW: 11.9 % (ref 11.1–15.7)
WBC Count: 4.4 10*3/uL (ref 3.9–10.0)

## 2018-05-10 LAB — VITAMIN B12: Vitamin B-12: 431 pg/mL (ref 180–914)

## 2018-05-10 LAB — RETICULOCYTES
RBC.: 4.38 MIL/uL (ref 3.70–5.45)
Retic Count, Absolute: 21.9 10*3/uL — ABNORMAL LOW (ref 33.7–90.7)
Retic Ct Pct: 0.5 % — ABNORMAL LOW (ref 0.7–2.1)

## 2018-05-10 NOTE — Progress Notes (Signed)
Hematology and Oncology Follow Up Visit  Krystal Mccoy [id number] June 08, 1966 52 y.o. 05/10/2018   Principle Diagnosis:  Iron deficiency anemia  Current Therapy:   IV iron infusion as indicated - last received inMarch 2019   Interim History: Krystal Mccoy is here today for follow-up. She was able to see Dr. Hyacinth Meeker and had an endometrial biopsy which was negative. Transvaginal US was also negative. She has a follow-up appointment to discuss oral contraception vs. IUD to help control her flow.  Her cycle remains heavy. No other bleeding, no bruising or petechiae.  She has fatigue off and on.  No fever, chills, n/v, cough, rash, dizziness, SOB, chest pain, palpitations, abdominal pain or changes in bowel or bladder habits.  No swelling, tenderness, numbness or tingling in her extremities. No c/o pain.  No lymphadenopathy noted on exam.  She has maintained a good appetite and is staying well hydrated. Her weight is stable.   ECOG Performance Status: 1 - Symptomatic but completely ambulatory  Medications:  Allergies as of 05/10/2018      Reactions   Clarithromycin Swelling   Tongue swelling Tongue swelling Tongue swelling   Prednisone Other (See Comments)   Amoxicillin Rash      Medication List        Accurate as of 05/10/18  2:06 PM. Always use your most recent med list.          aspirin 325 MG tablet Take 325 mg by mouth daily.   tolterodine 2 MG 24 hr capsule Commonly known as:  DETROL LA Take 1 capsule (2 mg total) by mouth daily.       Allergies:  Allergies  Allergen Reactions  . Clarithromycin Swelling    Tongue swelling Tongue swelling Tongue swelling  . Prednisone Other (See Comments)  . Amoxicillin Rash    Past Medical History, Surgical history, Social history, and Family History were reviewed and updated.  Review of Systems: All other 10 point review of systems is negative.   Physical Exam:  weight is 138 lb (62.6 kg). Her oral temperature is  97.6 F (36.4 C). Her blood pressure is 130/68 and her pulse is 75. Her respiration is 16 and oxygen saturation is 100%.   Wt Readings from Last 3 Encounters:  05/10/18 138 lb (62.6 kg)  04/26/18 139 lb 6.4 oz (63.2 kg)  04/12/18 136 lb 6.4 oz (61.9 kg)    Ocular: Sclerae unicteric, pupils equal, round and reactive to light Ear-nose-throat: Oropharynx clear, dentition fair Lymphatic: No cervical, supraclavicular or axillary adenopathy Lungs no rales or rhonchi, good excursion bilaterally Heart regular rate and rhythm, no murmur appreciated Abd soft, nontender, positive bowel sounds, no liver or spleen tip palpated on exam, no fluid wave  MSK no focal spinal tenderness, no joint edema Neuro: non-focal, well-oriented, appropriate affect Breasts: Deferred   Lab Results  Component Value Date   WBC 4.4 05/10/2018   HGB 12.9 05/10/2018   HCT 39.5 05/10/2018   MCV 90.2 05/10/2018   PLT 259 05/10/2018   Lab Results  Component Value Date   FERRITIN 29 02/08/2018   IRON 138 02/08/2018   TIBC 330 02/08/2018   UIBC 192 02/08/2018   IRONPCTSAT 42 02/08/2018   Lab Results  Component Value Date   RETICCTPCT 0.7 02/08/2018   RBC 4.38 05/10/2018   No results found for: KPAFRELGTCHN, LAMBDASER, KAPLAMBRATIO No results found for: IGGSERUM, IGA, IGMSERUM No results found for: TOTALPROTELP, ALBUMINELP, A1GS, A2GS, BETS, BETA2SER, GAMS, MSPIKE, SPEI   Chemistry  Component Value Date/Time   NA 138 03/18/2017 0957   K 4.0 03/18/2017 0957   CL 102 04/05/2013 1338   CO2 25 03/18/2017 0957   BUN 5.8 (L) 03/18/2017 0957   CREATININE 0.9 03/18/2017 0957      Component Value Date/Time   CALCIUM 9.0 03/18/2017 0957   ALKPHOS 82 03/18/2017 0957   AST 19 03/18/2017 0957   ALT 17 03/18/2017 0957   BILITOT 0.94 03/18/2017 0957      Impression and Plan: Krystal Mccoy is a very pleasant 52 yo caucasian female with iron deficiency anemia secondary to heavy cycles. She is still feeling  fatigue at times.  We will see what her iron studies show and bring her back in for infusion if needed.  We will go ahead and plan to see her back in another 3 months for follow-up.  She will contact our office with any questions or concerns. We can certainly see her sooner if need be.   Emeline Gins, NP 10/7/20192:06 PM

## 2018-05-11 LAB — FERRITIN: FERRITIN: 11 ng/mL (ref 11–307)

## 2018-05-11 LAB — IRON AND TIBC
IRON: 44 ug/dL (ref 41–142)
Saturation Ratios: 12 % — ABNORMAL LOW (ref 21–57)
TIBC: 366 ug/dL (ref 236–444)
UIBC: 322 ug/dL

## 2018-05-11 LAB — VITAMIN D 25 HYDROXY (VIT D DEFICIENCY, FRACTURES): Vit D, 25-Hydroxy: 30.5 ng/mL (ref 30.0–100.0)

## 2018-05-17 ENCOUNTER — Ambulatory Visit
Admission: RE | Admit: 2018-05-17 | Discharge: 2018-05-17 | Disposition: A | Discharge status: Home / Self Care | Payer: Managed Care, Other (non HMO) | Source: Ambulatory Visit | Attending: Obstetrics & Gynecology | Admitting: Obstetrics & Gynecology

## 2018-05-17 DIAGNOSIS — Z1231 Encounter for screening mammogram for malignant neoplasm of breast: Secondary | ICD-10-CM

## 2018-05-18 ENCOUNTER — Telehealth: Payer: Self-pay | Admitting: Obstetrics & Gynecology

## 2018-05-18 DIAGNOSIS — N920 Excessive and frequent menstruation with regular cycle: Secondary | ICD-10-CM

## 2018-05-18 NOTE — Telephone Encounter (Addendum)
Spoke with patient. Patient is scheduled for OV on 10/21 to discuss IUD vs POP. Patient states she knows a pill will not work for her, she has trouble remembering to take medication on regular basis. Patient request to proceed with mirena IUD insertion on 10/21 at 3:45pm. OV changed to IUD insertion, instructed patient to take Motrin 800 mg with food and water one hour before procedure. Advised I will review with Dr. Hyacinth Meeker on 10/16 and return call with any additonal recommendations. Patient agreeable.   BTL for contraceptive  Dr. Hyacinth Meeker  -ok to proceed with mirena IUD insertion as scheduled?   Cc: Harland Dingwall

## 2018-05-18 NOTE — Telephone Encounter (Signed)
Call placed to patient to review benefits for potential Mirena IUD procedure, as requested by provider. Patient is scheduled for an office visit with Dr Krystal Mccoy on 05/24/18 to discuss treatment options. Patient states she thought the appointment was for the IUD insertion. Forwarding to Triage to verify if appointment on 05/24/18 is scheduled correctly.   Forwarding to Triage Nurse

## 2018-05-19 ENCOUNTER — Telehealth: Payer: Self-pay | Admitting: Obstetrics & Gynecology

## 2018-05-19 NOTE — Telephone Encounter (Signed)
Yes, ok to proceed with insertion as scheduled.  Thanks.

## 2018-05-19 NOTE — Telephone Encounter (Signed)
Order placed for Mirena IUD insertion. Dx: N92.0 menorrhagia with regular cycle  Routing to [employer].   Encounter closed.

## 2018-05-24 ENCOUNTER — Ambulatory Visit: Payer: Managed Care, Other (non HMO) | Admitting: Obstetrics & Gynecology

## 2018-05-24 ENCOUNTER — Encounter: Payer: Self-pay | Admitting: Obstetrics & Gynecology

## 2018-05-24 DIAGNOSIS — N920 Excessive and frequent menstruation with regular cycle: Secondary | ICD-10-CM

## 2018-05-24 DIAGNOSIS — Z30430 Encounter for insertion of intrauterine contraceptive device: Secondary | ICD-10-CM

## 2018-05-24 NOTE — Progress Notes (Signed)
52 y.o. [id number] Married Caucasian female presents for insertion of Mirena IUD for treatment of menorrhagia.  She has been counseled about other options but has declined any surgical options.  She does not want to be on OCPs either.  H/O BTL for contraception.  Had endometrial biopsy for evaluation that was negative for abnormal cells.  She feels IUD is the better option for her.  Pt has also been counseled about risks and benefits as well as complications.  Consent is obtained today.  All questions answered prior to start of procedure.    Current contraception: BTL Last STD testing:  N/a, in long term monogamous marriage LMP:  Patient's last menstrual period was 05/16/2018 (exact date).  Patient Active Problem List   Diagnosis Date Noted  . IDA (iron deficiency anemia) 03/18/2017  . 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 Cough 07/26/2013  . Back pain with radiation 12/15/2012  . 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 Seasonal allergic rhinitis 11/22/2012  . Fatigue 11/22/2012  . Plantar wart of right foot 11/22/2012  . Stress at home 11/22/2012   Past Medical History:  Diagnosis Date  . Allergy   . Asthma   . Clotting disorder (HCC)    DVT  . Iron deficiency anemia   . Menorrhagia with regular cycle   . Vitamin D deficiency    Current Outpatient Medications on File Prior to Visit  Medication Sig Dispense Refill  . aspirin 325 MG tablet Take 325 mg by mouth daily.    Marland Kitchen tolterodine (DETROL LA) 2 MG 24 hr capsule Take 1 capsule (2 mg total) by mouth daily. 30 capsule 1   No current facility-administered medications on file prior to visit.    Clarithromycin; Prednisone; and Amoxicillin  Review of Systems  All other systems reviewed and are negative.  Vitals:   05/24/18 1526  BP: 122/64  Pulse: 76  Resp: 16  Weight: 139 lb 9.6 oz (63.3 kg)  Height: 5\' 5"  (1.651 m)    Gen:  WNWF healthy female NAD Abdomen: soft, non-tender Groin:  no inguinal nodes  palpated  Pelvic exam: Vulva:  normal female genitalia Vagina:  normal vagina, no discharge, exudate, lesion, or erythema Cervix:  Non-tender, Negative CMT, no lesions or redness. Uterus:  normal shape, position and consistency   Procedure:  Speculum reinserted.  Cervix visualized and cleansed with Betadine x 3.  Paracervical block was not placed.  Single toothed tenaculum applied to anterior lip of cervix without difficulty.  1% Lidocaine without epinephrine was used.  Lot number: [id number].  Expiration:  06/2020  IUD package was opened.  IUD and introducer passed to fundus and then withdrawn slightly before IUD was passed into endometrial cavity.  Introducer removed.  Strings cut to 2cm.  Tenaculum removed from cervix.  Minimal bleeding noted.  Pt tolerated the procedure well.  All instruments removed from vagina.  A: Insertion of Mirena IUD Menorrhagia  P:  Return for recheck 8 weeks Pt aware to call for any concerns Pt aware removal due no later than 05/25/2023.  IUD card given to pt.

## 2018-06-07 ENCOUNTER — Inpatient Hospital Stay: Payer: Managed Care, Other (non HMO) | Attending: Hematology & Oncology

## 2018-06-17 ENCOUNTER — Other Ambulatory Visit: Payer: Self-pay | Admitting: Obstetrics & Gynecology

## 2018-06-18 NOTE — Telephone Encounter (Signed)
Medication refill request: detrol LA  Last OV:  05-24-18 SM Next OV: 07-26-18  Last MMG (if hormonal medication request): 05-17-18 density C/BIRADS 1 negative  Refill authorized: 04-12-18 #30, 1 RF. Today, please advise.   Medication pended to pharmacy for #30, 1RF. Patient has appointment on 07-26-18. Please advise.

## 2018-07-07 ENCOUNTER — Telehealth: Payer: Self-pay | Admitting: Obstetrics & Gynecology

## 2018-07-07 NOTE — Telephone Encounter (Signed)
Spoke with patient. Mirena IUD placed 05/24/18. Patient reports menses started 11/6 and has not stopped since. Patient reports flow goes from spotting to heavy. Patient states she has always had diarrhea with her menses, diarrhea is preset with flow. Reports increased fatigue. Denies pain, SOB, lightheadedness, N/V, fever/chills. Currently changing 3 pads per day.   Patient states she thought bleeding would have stopped before now, is concerned with placement of IUD and hgb. Advised patient can take at least 3 mo for menses to regulate with new contraceptive. Advised to continue to monitor menses, if bleeding becomes heavy or new symptoms develop, return call to office. Advised patient to f/u with PCP for further evaluation of diarrhea. Offered patient OV, OV scheduled for 12/9 at 2:30pm with Dr. Hyacinth MeekerMiller. Patient declined earlier OV, can only come on Monday afternoon between 12 and 3pm. Advised Dr. Hyacinth MeekerMiller will review, I return call with any additional recommendations. Patient verbalizes understanding.   Routing to provider for final review. Patient is agreeable to disposition. Will close encounter.

## 2018-07-07 NOTE — Telephone Encounter (Signed)
Patient called stating that she "is having a lot of issues" after getting the IUD inserted and would like to speak with someone. Patient did not give any further details.

## 2018-07-08 NOTE — Telephone Encounter (Signed)
She should have an ultrasound done.  Please schedule at [facility] to assess placement.  I'd like her to come here after the appt to review results and make recommendations if changes are needed.  Thanks.

## 2018-07-09 ENCOUNTER — Telehealth: Payer: Self-pay | Admitting: Obstetrics & Gynecology

## 2018-07-09 NOTE — Telephone Encounter (Signed)
Patient called and cancelled her appointment for IUD check and fatigue for Monday, 07/12/18, with Dr. Hyacinth MeekerMiller. She said she is feeling better now and will see Dr. Hyacinth MeekerMiller at her next scheduled appointment for an 8 week recheck on 07/26/18. Routing to provider for FYI only.

## 2018-07-09 NOTE — Telephone Encounter (Signed)
Left message to call Filemon Breton, RN at [facility] [phone].   

## 2018-07-12 ENCOUNTER — Ambulatory Visit: Payer: Self-pay | Admitting: Obstetrics & Gynecology

## 2018-07-12 NOTE — Telephone Encounter (Signed)
See telephone encounter dated 07/09/18.

## 2018-07-21 ENCOUNTER — Telehealth: Payer: Self-pay | Admitting: Obstetrics & Gynecology

## 2018-07-21 NOTE — Telephone Encounter (Signed)
Spoke with patient. Patient requesting to reschedule IUD check up with Dr. Hyacinth MeekerMiller. Patient states she is only available on Monday mornings and on 12/30 on or after 4pm. Patient does not drive and has limited availability for transportation. Patient declines OV with another provider. Patient denies any current GYN complaints.   OV scheduled for 12/30 at 4pm with Dr. Hyacinth MeekerMiller.   Routing to provider for final review. Patient is agreeable to disposition. Will close encounter.

## 2018-07-21 NOTE — Telephone Encounter (Signed)
Patient would like to reschedule her office visit with Dr. Hyacinth MeekerMiller that was previously scheduled for 07/26/18. States she needs a morning appointment on a Monday. Notified patient that Dr. Hyacinth MeekerMiller is in surgery on Monday mornings. Unsure of where to schedule her.

## 2018-07-26 ENCOUNTER — Ambulatory Visit: Payer: Managed Care, Other (non HMO) | Admitting: Obstetrics & Gynecology

## 2018-07-30 ENCOUNTER — Telehealth: Payer: Self-pay | Admitting: Obstetrics & Gynecology

## 2018-07-30 NOTE — Telephone Encounter (Signed)
Spoke with patient.  C/o 3 days of L sided back pain that radiates down the back of her leg. Started when she attempted to stand up off the couch and was unable to get up.  Denies any trauma or falls.  States her husband has had to help her get up and down since then. Using a heating pad and some ASA but not improved. No loss of bowels or bladder.  Asked patient if she has a PCP and she states she has not called them because they do not have appointments available.  Advised pain not likely gynecologic in nature. Pt with hx of back pain/arthritis. Denies vaginal bleeding or discharge, some spotting, but no heavy bleeding. No fevers or pelvic pain.   Advised patient needs to see emergency care. Call 911 at this time if she does not have a ride. Patient expresses agreement and states she will go to closest ED.   Patient has appointment with Dr. Hyacinth MeekerMiller on 08/02/18. Advised to keep that appointment but needs to be seen first by ED.   Routing to Dr. Hyacinth MeekerMiller. Okay to close?

## 2018-07-30 NOTE — Telephone Encounter (Signed)
OK to close encounter. 

## 2018-07-30 NOTE — Telephone Encounter (Signed)
Since Wednesday, patient is having pain on left side and back which shoots into her leg. States she cannot walk, stand or bend over and it is hard to stand up from sitting.

## 2018-08-02 ENCOUNTER — Ambulatory Visit: Payer: Managed Care, Other (non HMO) | Admitting: Obstetrics & Gynecology

## 2018-08-02 VITALS — BP 130/68 | HR 72 | Resp 14 | Ht 65.0 in | Wt 142.0 lb

## 2018-08-02 DIAGNOSIS — N926 Irregular menstruation, unspecified: Secondary | ICD-10-CM

## 2018-08-02 DIAGNOSIS — Z86718 Personal history of other venous thrombosis and embolism: Secondary | ICD-10-CM

## 2018-08-02 DIAGNOSIS — Z30430 Encounter for insertion of intrauterine contraceptive device: Secondary | ICD-10-CM

## 2018-08-02 DIAGNOSIS — M5430 Sciatica, unspecified side: Secondary | ICD-10-CM

## 2018-08-02 DIAGNOSIS — R109 Unspecified abdominal pain: Secondary | ICD-10-CM

## 2018-08-02 DIAGNOSIS — M549 Dorsalgia, unspecified: Secondary | ICD-10-CM | POA: Diagnosis not present

## 2018-08-02 LAB — POCT URINALYSIS DIPSTICK
BILIRUBIN UA: NEGATIVE
Glucose, UA: NEGATIVE
Ketones, UA: NEGATIVE
Leukocytes, UA: NEGATIVE
Nitrite, UA: NEGATIVE
PH UA: 5 (ref 5.0–8.0)
PROTEIN UA: NEGATIVE
RBC UA: 2
UROBILINOGEN UA: NEGATIVE U/dL — AB

## 2018-08-02 NOTE — Progress Notes (Signed)
GYNECOLOGY  VISIT  CC:   Vaginal bleeding and back pain  HPI: 52 y.o. [id number] Married White or Caucasian female here for IUD check.  IUD placed 05/24/18.  Reports she had bleeding most of November and at times it was very heavy.  On the heaviest days, she had to change pads about 5 or 6 times.  She did not pass clots with this bleeding.  Overall, heaviness is improved but bleeding is lasting longer.  She has not tried to check her IUD strings.   Also reports having back pain that starts on her left side and this is going down the left side of her butt and legs.  She reports she was just standing up from the cough when this started.  She wasn't really doing anything else.  She has taking Tylenol and using a heading pad but this hasn't helped.  Pain is better when she isn't moving.  Standing upright is also bothersome.   Patient states that she started to lower left flank pain on 07/28/18 and that her menstrual cycle started the day after.  She is still spotting today.  GYNECOLOGIC HISTORY: No LMP recorded. Contraception: IUD Menopausal hormone therapy: none  Patient Active Problem List   Diagnosis Date Noted  . IDA (iron deficiency anemia) 03/18/2017  . 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 Cough 07/26/2013  . Back pain with radiation 12/15/2012  . 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 Seasonal allergic rhinitis 11/22/2012  . Fatigue 11/22/2012  . Plantar wart of right foot 11/22/2012  . Stress at home 11/22/2012    Past Medical History:  Diagnosis Date  . Allergy   . Asthma   . Clotting disorder (HCC)    DVT  . Iron deficiency anemia   . Menorrhagia with regular cycle   . Vitamin D deficiency     Past Surgical History:  Procedure Laterality Date  . TUBAL LIGATION      MEDS:   Current Outpatient Medications on File Prior to Visit  Medication Sig Dispense Refill  . aspirin 325 MG tablet Take 325 mg by mouth daily.    Marland Kitchen. tolterodine (DETROL LA) 2 MG  24 hr capsule TAKE ONE CAPSULE BY MOUTH DAILY 30 capsule 1   No current facility-administered medications on file prior to visit.     ALLERGIES: Clarithromycin; Prednisone; and Amoxicillin  Family History  Problem Relation Age of Onset  . Heart disease Mother   . Heart disease Father   . Kidney cancer Father     SH:  Married, non smoker  Review of Systems  All other systems reviewed and are negative.   PHYSICAL EXAMINATION:    BP 130/68   Ht 5\' 5"  (1.651 m)   Wt 142 lb (64.4 kg)   BMI 23.63 kg/m     General appearance: alert, cooperative and appears stated age CV:  Regular rate and rhythm Lungs:  clear to auscultation, no wheezes, rales or rhonchi, symmetric air entry Abdomen: soft, non-tender; bowel sounds normal; no masses,  no organomegaly Flank:  No cva tenderness Lymph:  no inguinal LAD noted  Pelvic: External genitalia:  no lesions              Urethra:  normal appearing urethra with no masses, tenderness or lesions              Bartholins and Skenes: normal  Vagina: normal appearing vagina with normal color and discharge, no lesions              Cervix: no lesions and IUD string noted              Bimanual Exam:  Uterus:  normal size, contour, position, consistency, mobility, non-tender              Adnexa: no mass, fullness, tenderness  Chaperone was present for exam.  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 Pt does not want any additional treatment for bleeding at this time.  Will plan to recheck in 2 to 3 months.  If bleeding is still irregular like in November and December, consider removal of Mirena IUD

## 2018-08-03 ENCOUNTER — Encounter: Payer: Self-pay | Admitting: Obstetrics & Gynecology

## 2018-08-03 DIAGNOSIS — Z86718 Personal history of other venous thrombosis and embolism: Secondary | ICD-10-CM | POA: Insufficient documentation

## 2018-08-05 ENCOUNTER — Telehealth: Payer: Self-pay | Admitting: *Deleted

## 2018-08-05 NOTE — Telephone Encounter (Signed)
Follow-up call to patient. States she is much better after visit to ortho after-hour clinic on 12--30-19.  Was treated for muscle spasms with injection of pain med and muscle relaxer.  IUD recheck scheduled for 10-04-18 at 1245. Patient to call back if any concerns prior to appointment.   Routing to Dr Hyacinth Meeker. Encounter closed.

## 2018-08-09 ENCOUNTER — Inpatient Hospital Stay: Payer: Managed Care, Other (non HMO)

## 2018-08-09 ENCOUNTER — Inpatient Hospital Stay: Payer: Managed Care, Other (non HMO) | Attending: Hematology & Oncology | Admitting: Family

## 2018-10-04 ENCOUNTER — Encounter: Payer: Self-pay | Admitting: Obstetrics & Gynecology

## 2018-10-04 ENCOUNTER — Ambulatory Visit: Payer: Managed Care, Other (non HMO) | Admitting: Obstetrics & Gynecology

## 2018-10-04 VITALS — BP 118/66 | HR 72 | Resp 14 | Wt 139.2 lb

## 2018-10-04 DIAGNOSIS — K591 Functional diarrhea: Secondary | ICD-10-CM

## 2018-10-04 DIAGNOSIS — N852 Hypertrophy of uterus: Secondary | ICD-10-CM

## 2018-10-04 DIAGNOSIS — Z30431 Encounter for routine checking of intrauterine contraceptive device: Secondary | ICD-10-CM

## 2018-10-04 DIAGNOSIS — Z86718 Personal history of other venous thrombosis and embolism: Secondary | ICD-10-CM

## 2018-10-04 NOTE — Patient Instructions (Signed)
Align probiotic.  You take one daily for a month.   Pepto bismol or imodium also work.

## 2018-10-04 NOTE — Progress Notes (Signed)
53 y.o. [id number] Married Caucasian female presents for followed up after insertion of Mirena IUD on 05/25/2019.  She had spotting off and on in January and into February.  Reports it got lighter and lighter.  Hasn't had any real significant bleeding since December.  Now, no bleeding for the past couple of weeks.  She does have some mild headache at times and she has diarrhea at times.  Reports she last time she had diarrhea was about a week ago.  She actually describes this as "upset stomach" and not diarrhea.  States she doesn't drink ice tea because this causes diarrhea (but that was years ago).  She won't eat at [employer] because the last time she at there (2 years ago), she had issues with diarrhea.  More recently, she thinks applesauce and yogurt can cause diarrhea.    LMP:  Patient's last menstrual period was 09/13/2018 (within weeks).  Patient Active Problem List   Diagnosis Date Noted  . History of DVT (deep vein thrombosis) 08/03/2018  . IDA (iron deficiency anemia) 03/18/2017  . 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 Cough 07/26/2013  . Back pain with radiation 12/15/2012  . 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 Seasonal allergic rhinitis 11/22/2012  . Fatigue 11/22/2012  . Plantar wart of right foot 11/22/2012  . Stress at home 11/22/2012   Past Medical History:  Diagnosis Date  . Allergy   . Asthma   . Clotting disorder (HCC)    DVT  . Iron deficiency anemia   . Menorrhagia with regular cycle   . Vitamin D deficiency    Current Outpatient Medications on File Prior to Visit  Medication Sig Dispense Refill  . aspirin 325 MG tablet Take 325 mg by mouth daily.    Marland Kitchen tolterodine (DETROL LA) 2 MG 24 hr capsule TAKE ONE CAPSULE BY MOUTH DAILY 30 capsule 1   No current facility-administered medications on file prior to visit.    Clarithromycin; Doxycycline; Prednisone; and Amoxicillin  Review of Systems  Gastrointestinal: Positive for diarrhea.  All  other systems reviewed and are negative.  Vitals:   10/04/18 1247  BP: 118/66  Pulse: 72  Resp: 14  Weight: 139 lb 3.2 oz (63.1 kg)    Gen:  WNWF healthy female NAD Abdomen: soft, non-tender Groin:  no inguinal nodes palpated  Pelvic exam: Vulva:  normal female genitalia Vagina:  normal vagina Cervix:  Non-tender, Negative CMT, no lesions or redness.  IUD string noted about 1.5cm in length.   Uterus:  12 weeks in size, non tender    A: IUD follow-up after insertion of Mirena IUD on 10/21/201( DUB after placement that seems to have resolved Intermittent issues with diarrhea, possible IBS H/o DVT Enlarged uterus with 2.5cm endometrium with negative biopsy 9/19  P:  Pt declined colonoscopy.  cologuard ordered for pt [place] [facility] probiotic recommended.  If this doesn't help, suggested peptobismol vs imodium  Follow-up for AEX. Pt knows to call with any new concerns or problems.  ~15 minutes spent with patient >50% of time was in face to face discussion of above.

## 2019-02-22 ENCOUNTER — Telehealth: Payer: Self-pay | Admitting: *Deleted

## 2019-02-22 NOTE — Telephone Encounter (Signed)
Notification received from [employer] that Cologuard ordered on 10/07/18 has not been completed.   Call to patient. Patient states she has cologuard kit, plans to complete, but now is not a good time. Patient states she will contact the office if any additional questions. Is aware to contact [employer] if any questions regarding collection.  Routing to Dr. Lestine Box.   Encounter closed.

## 2019-02-24 LAB — COLOGUARD: Cologuard: NEGATIVE

## 2019-03-04 ENCOUNTER — Telehealth: Payer: Self-pay | Admitting: *Deleted

## 2019-03-04 DIAGNOSIS — Z1211 Encounter for screening for malignant neoplasm of colon: Secondary | ICD-10-CM

## 2019-03-04 NOTE — Telephone Encounter (Signed)
Patient notified of negative Cologuard results. Verbalized understanding.  -Encounter closed.  Report to scan

## 2019-03-14 ENCOUNTER — Ambulatory Visit: Payer: Managed Care, Other (non HMO) | Admitting: Obstetrics & Gynecology

## 2019-03-18 ENCOUNTER — Other Ambulatory Visit: Payer: Self-pay

## 2019-03-19 IMAGING — MG DIGITAL SCREENING BILATERAL MAMMOGRAM WITH TOMO AND CAD
8 series · 9 of 24 positions shown · non-contrast
Comparison: None.

CLINICAL DATA: Screening.

EXAM:
DIGITAL SCREENING BILATERAL MAMMOGRAM WITH TOMO AND CAD

[R CC synth-2D]
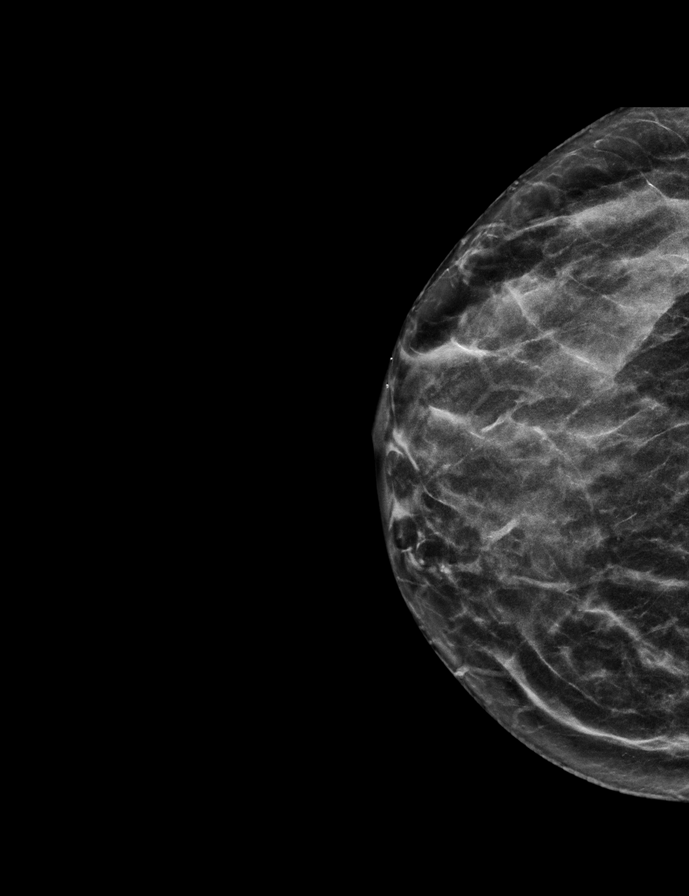

[R MLO synth-2D]
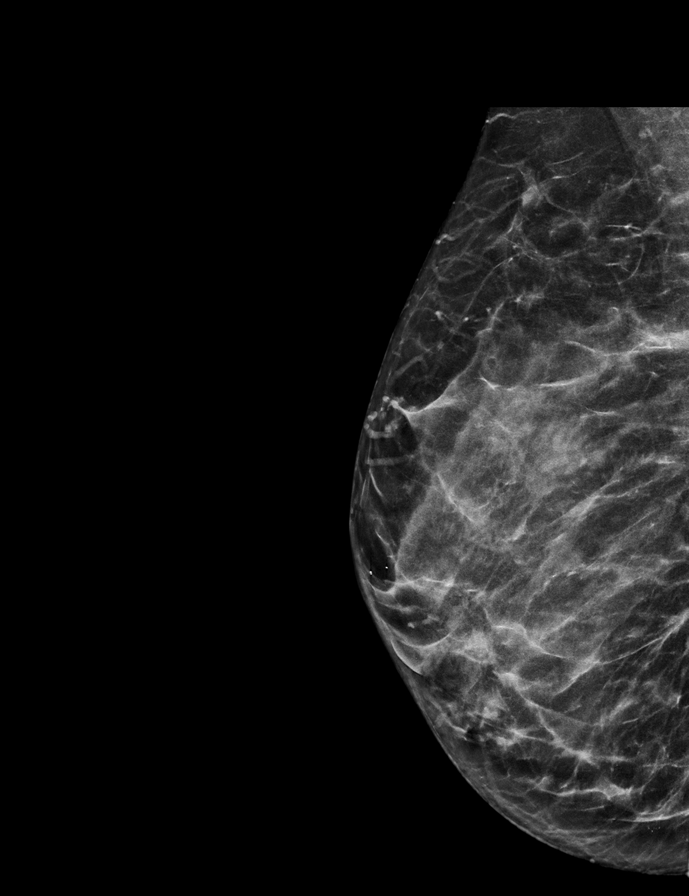

[L MLO synth-2D]
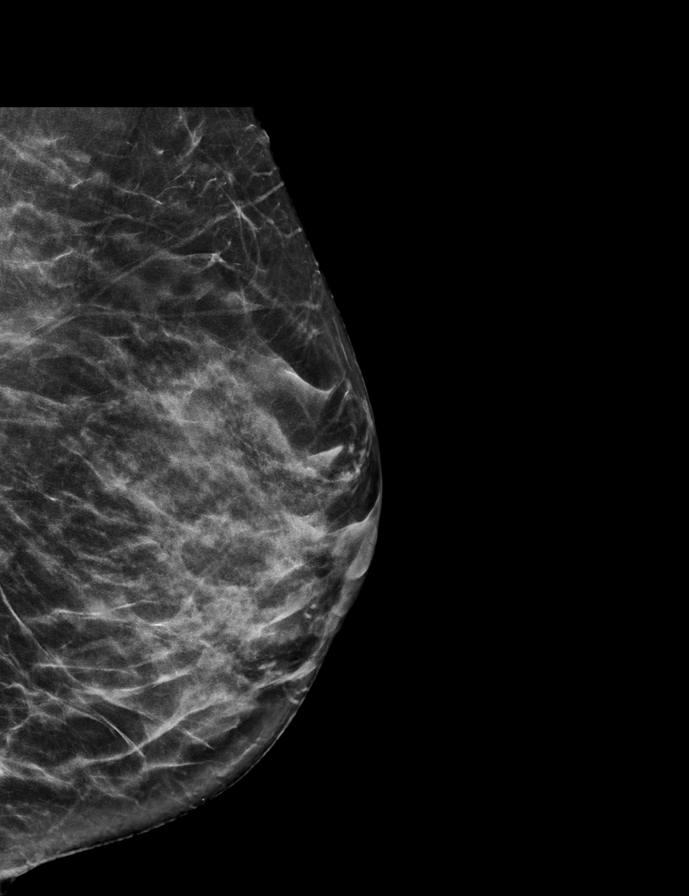

[L CC synth-2D]
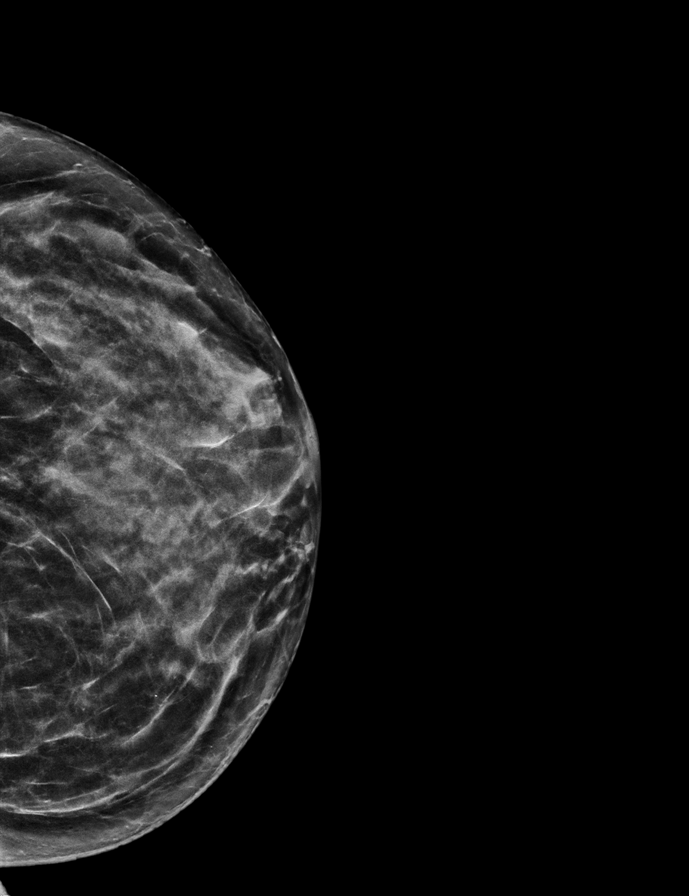

[L CC tomo · 2 of 52 frames shown]
[frame 17/52]
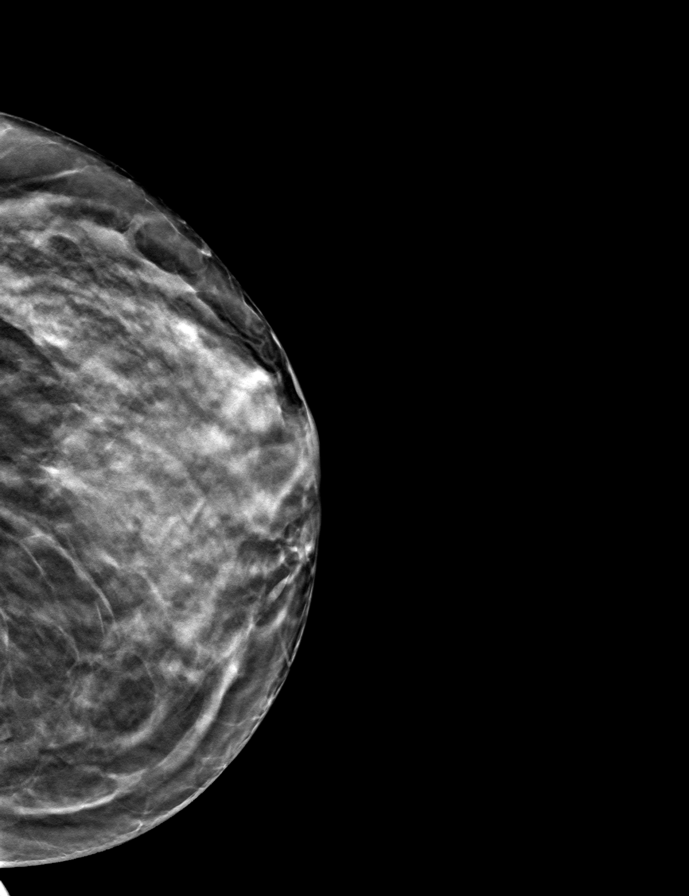
[frame 27/52]
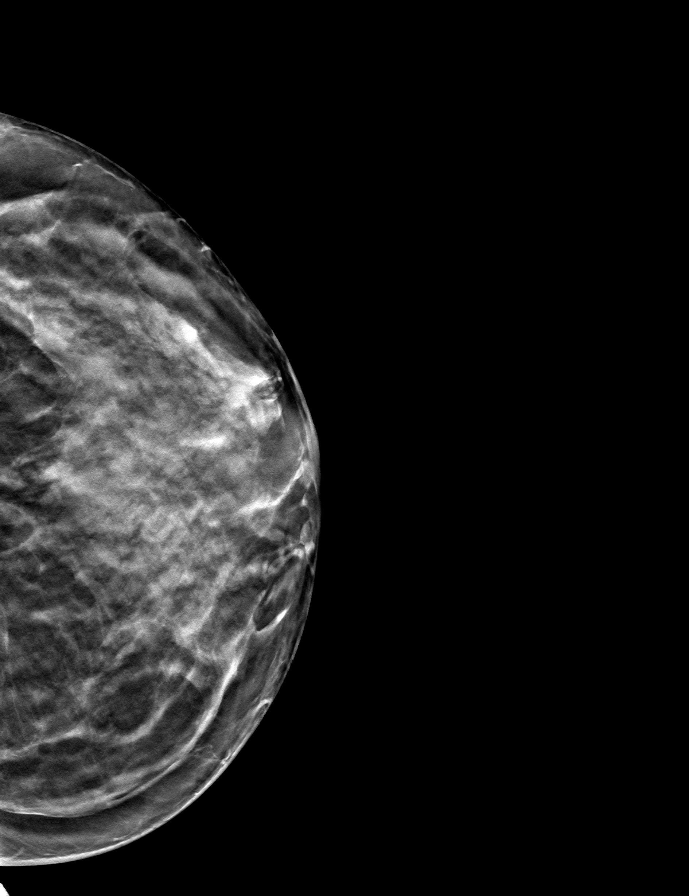

[L MLO tomo · tomo slice 29/56.0]
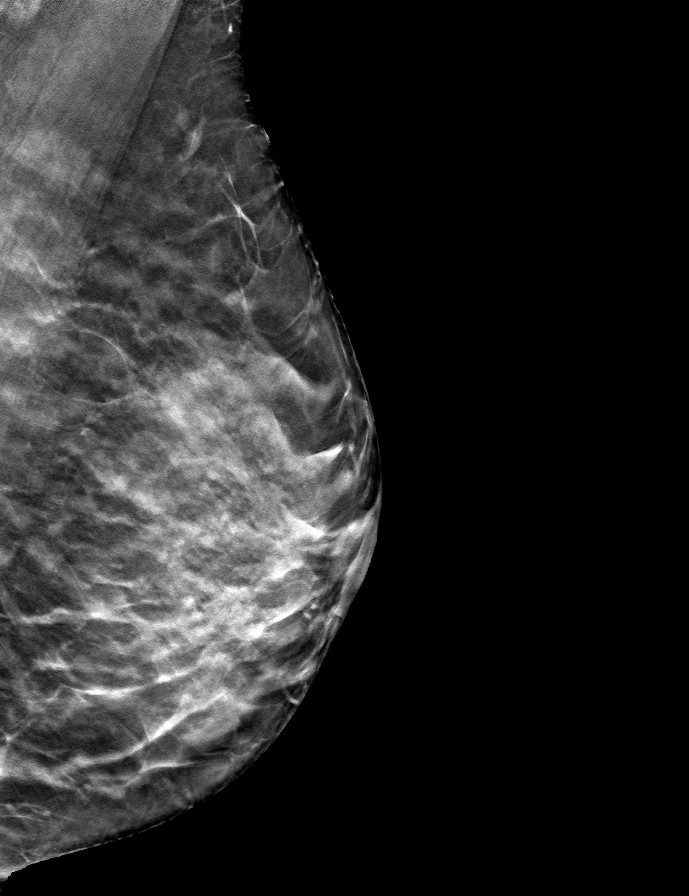

[R CC tomo · tomo slice 27/52.0]
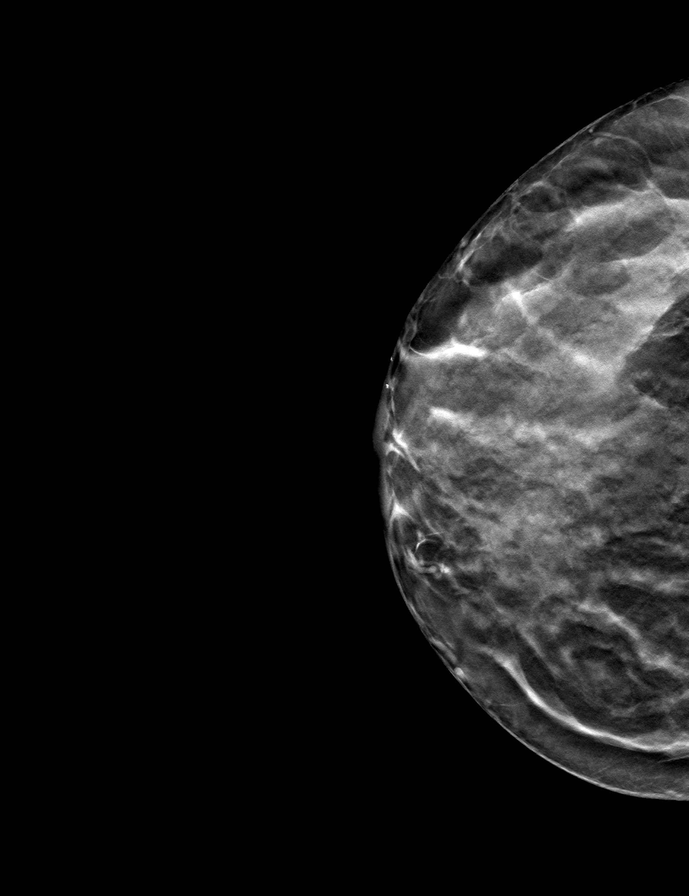

[R MLO tomo · tomo slice 28/55.0]
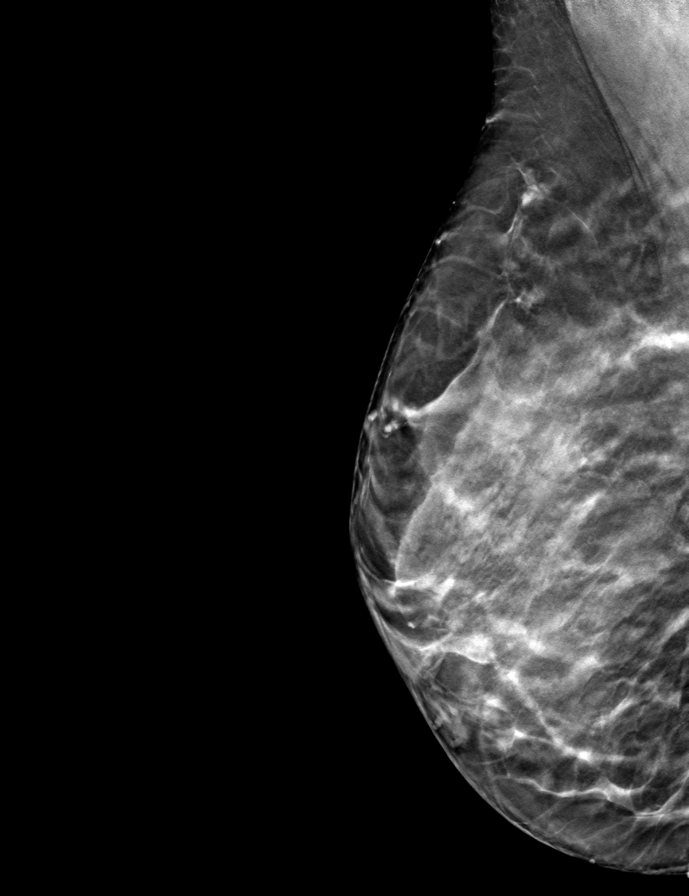

[9 of 24 positions shown; findings below may reference images not displayed]

ACR Breast Density Category c: The breast tissue is heterogeneously
dense, which may obscure small masses
FINDINGS: There are no findings suspicious for malignancy. Images were
processed with CAD.
IMPRESSION: No mammographic evidence of malignancy. A result letter of this
screening mammogram will be mailed directly to the patient.

RECOMMENDATION:
Screening mammogram in one year. (Code:EM-2-IHY)

BI-RADS CATEGORY  1: Negative.

## 2019-03-21 ENCOUNTER — Ambulatory Visit: Payer: Managed Care, Other (non HMO) | Admitting: Obstetrics & Gynecology

## 2019-03-21 ENCOUNTER — Other Ambulatory Visit: Payer: Self-pay

## 2019-03-21 ENCOUNTER — Encounter: Payer: Self-pay | Admitting: Obstetrics & Gynecology

## 2019-03-21 VITALS — BP 112/84 | HR 76 | Temp 97.5°F | Ht 65.0 in | Wt 134.0 lb

## 2019-03-21 DIAGNOSIS — Z01419 Encounter for gynecological examination (general) (routine) without abnormal findings: Secondary | ICD-10-CM

## 2019-03-21 NOTE — Progress Notes (Signed)
53 y.o. [id number] Married White or Caucasian female here for annual exam.  She is doing well.  Denies vaginal bleeding.  She is still having some "PMS-like" symptoms.  This is very tolerable for her.  IUD placed 05/2018.    PCP:  Sadie Haber at [facility].  Was seen last week.  Having issues with fatigue.  Blood work showed thyroid was abnormal.  Has been referred to endocrinology.  Has appt for Monday.  Patient's last menstrual period was 06/04/2018 (approximate).          Sexually active: Yes.    The current method of family planning is tubal ligation and IUD.   Mirena placed 05/24/18  Exercising: No.   Smoker:  no  Health Maintenance: Pap:  04/12/18 Neg. HR HPV:neg  History of abnormal Pap:  no MMG:  05/17/18 BIRADS1:neg  Colonoscopy:  Cologuard 02/23/19 Neg  BMD:   Never TDaP:  2019 Pneumonia vaccine(s):  n/a Shingrix:   No Hep C testing: n/a Screening Labs: PCP - [facility]    reports that she has never smoked. She has never used smokeless tobacco. She reports that she does not drink alcohol or use drugs.  Past Medical History:  Diagnosis Date  . Allergy   . Asthma   . Clotting disorder (HCC)    DVT  . Iron deficiency anemia   . Menorrhagia with regular cycle   . Vitamin D deficiency     Past Surgical History:  Procedure Laterality Date  . TUBAL LIGATION      Current Outpatient Medications  Medication Sig Dispense Refill  . aspirin 325 MG tablet Take 325 mg by mouth daily.    . Probiotic Product (PROBIOTIC-10 PO) Take by mouth daily.     No current facility-administered medications for this visit.     Family History  Problem Relation Age of Onset  . Heart disease Mother   . Heart disease Father   . Kidney cancer Father     Review of Systems  Constitutional: Positive for fatigue.  Neurological: Positive for dizziness and light-headedness.  All other systems reviewed and are negative.   Exam:   BP 112/84   Pulse 76   Temp (!) 97.5 F (36.4 C) (Temporal)   Ht 5\' 5"   (1.651 m)   Wt 134 lb (60.8 kg)   LMP 06/04/2018 (Approximate)   BMI 22.30 kg/m  Weight: -5#  Height: 5\' 5"  (165.1 cm)  Ht Readings from Last 3 Encounters:  03/21/19 5\' 5"  (1.651 m)  08/02/18 5\' 5"  (1.651 m)  05/24/18 5\' 5"  (1.651 m)    General appearance: alert, cooperative and appears stated age Head: Normocephalic, without obvious abnormality, atraumatic Neck: no adenopathy, supple, symmetrical, trachea midline and thyroid normal to inspection and palpation Lungs: clear to auscultation bilaterally Breasts: normal appearance, no masses or tenderness Heart: regular rate and rhythm Abdomen: soft, non-tender; bowel sounds normal; no masses,  no organomegaly Extremities: extremities normal, atraumatic, no cyanosis or edema Skin: Skin color, texture, turgor normal. No rashes or lesions Lymph nodes: Cervical, supraclavicular, and axillary nodes normal. No abnormal inguinal nodes palpated Neurologic: Grossly normal   Pelvic: External genitalia:  no lesions              Urethra:  normal appearing urethra with no masses, tenderness or lesions              Bartholins and Skenes: normal                 Vagina:  normal appearing vagina with normal color and discharge, no lesions              Cervix: no lesions              Pap taken: No. Bimanual Exam:  Uterus:  normal size, contour, position, consistency, mobility, non-tender              Adnexa: normal adnexa and no mass, fullness, tenderness               Rectovaginal: Confirms               Anus:  normal sphincter tone, no lesions  Chaperone was present for exam.  A:  Well Woman with normal exam H/O menorrhagia, resolved with Mirena IUD placement 10/19 H/o iron deficiency anemia (reports hb last week was normal) Second degree uterine prolapse OAB  P:   Mammogram guidelines reviewed.  Aware this is due. pap smear with neg HR HPV 2019 Cologuard neg 2020.  Repeat 3 years. Endocrinology appt is scheduled for next week. Blood work  just done last week. Tdap is UTD Declines Shingrix Return annually or prn

## 2019-05-12 ENCOUNTER — Telehealth: Payer: Self-pay | Admitting: Obstetrics & Gynecology

## 2019-05-12 NOTE — Telephone Encounter (Signed)
Return call to patient. Voice mailbox is not set up. Unable to leave message.

## 2019-05-12 NOTE — Telephone Encounter (Signed)
Patient had mirena inserted 05/24/18. Patient states she started bleeding on Sunday for the first time since. Would like to know if this is normal.

## 2019-05-12 NOTE — Telephone Encounter (Signed)
Patient returns call. Reports bleeding stopped in November 2019 following Mirena insertion in October 2019. Onset of bleeding Sunday 05-08-19. Bleeding like a light period. Denies pain or cramping. Recent intercourse without issue.  Has never been able to feel strings. Advised office visit recommended for IUD check and assessment of bleeding.  Patient declines appointment today. Reports only available time is Monday before 12. Attempted to schedule for day when ultrasound available- patient repeatedly insists no other option except Mondays.  Patient scheduled for Monday, 05-16-19 at 1130 with Dr Talbert Nan ( Dr Sabra Heck out of office).  Routing to provider. Encounter closed.   CC: Dr Talbert Nan.

## 2019-05-13 NOTE — Telephone Encounter (Signed)
Call reviewed with Dr. Sabra Heck, ok to keep OV as scheduled for 05/16/19 at 11:30am with Dr. Talbert Nan.   Call to patient to notify. Patient is agreeable to date and time.   Routing to Dr. Sabra Heck.   Cc: Dr. Talbert Nan, Lamont Snowball, RN

## 2019-05-16 ENCOUNTER — Ambulatory Visit: Payer: Managed Care, Other (non HMO) | Admitting: Obstetrics and Gynecology

## 2019-05-16 ENCOUNTER — Telehealth: Payer: Self-pay | Admitting: Obstetrics and Gynecology

## 2019-05-16 ENCOUNTER — Ambulatory Visit: Payer: Self-pay | Admitting: Obstetrics and Gynecology

## 2019-05-16 NOTE — Progress Notes (Deleted)
GYNECOLOGY  VISIT   HPI: 53 y.o.   Married White or Caucasian Not Hispanic or Latino  female   [id number] with No LMP recorded. (Menstrual status: IUD).   here for IUD check.    GYNECOLOGIC HISTORY: No LMP recorded. (Menstrual status: IUD). Contraception: IUD Menopausal hormone therapy: None        OB History    Gravida  4   Para  4   Term  4   Preterm      AB      Living  4     SAB      TAB      Ectopic      Multiple      Live Births  4              Patient Active Problem List   Diagnosis Date Noted  . History of DVT (deep vein thrombosis) 08/03/2018  . IDA (iron deficiency anemia) 03/18/2017  . 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 Back pain with radiation 12/15/2012  . 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 Seasonal allergic rhinitis 11/22/2012  . Fatigue 11/22/2012  . Plantar wart of right foot 11/22/2012  . Stress at home 11/22/2012    Past Medical History:  Diagnosis Date  . Allergy   . Asthma   . Clotting disorder (HCC)    DVT  . Iron deficiency anemia   . Menorrhagia with regular cycle   . Vitamin D deficiency     Past Surgical History:  Procedure Laterality Date  . TUBAL LIGATION      Current Outpatient Medications  Medication Sig Dispense Refill  . aspirin 325 MG tablet Take 325 mg by mouth daily.    . Probiotic Product (PROBIOTIC-10 PO) Take by mouth daily.     No current facility-administered medications for this visit.      ALLERGIES: Clarithromycin, Doxycycline, Prednisone, and Amoxicillin  Family History  Problem Relation Age of Onset  . Heart disease Mother   . Heart disease Father   . Kidney cancer Father     Social History   Socioeconomic History  . Marital status: Married    Spouse name: Not on file  . Number of children: Not on file  . Years of education: Not on file  . Highest education level: Not on file  Occupational History  . Not on file  Social Needs  . Financial resource  strain: Not on file  . Food insecurity    Worry: Not on file    Inability: Not on file  . Transportation needs    Medical: Not on file    Non-medical: Not on file  Tobacco Use  . Smoking status: Never Smoker  . Smokeless tobacco: Never Used  Substance and Sexual Activity  . Alcohol use: No  . Drug use: No  . Sexual activity: Yes    Birth control/protection: Surgical  Lifestyle  . Physical activity    Days per week: Not on file    Minutes per session: Not on file  . Stress: Not on file  Relationships  . Social Herbalist on phone: Not on file    Gets together: Not on file    Attends religious service: Not on file    Active member of club or organization: Not on file    Attends meetings of clubs or organizations: Not on file    Relationship status: Not on file  . Intimate partner violence  Fear of current or ex partner: Not on file    Emotionally abused: Not on file    Physically abused: Not on file    Forced sexual activity: Not on file  Other Topics Concern  . Not on file  Social History Narrative  . Not on file    ROS  PHYSICAL EXAMINATION:    There were no vitals taken for this visit.    General appearance: alert, cooperative and appears stated age Neck: no adenopathy, supple, symmetrical, trachea midline and thyroid {CHL AMB PHY EX THYROID NORM DEFAULT:[phone]::"normal to inspection and palpation"} Breasts: {Exam; breast:13139::"normal appearance, no masses or tenderness"} Abdomen: soft, non-tender; non distended, no masses,  no organomegaly  Pelvic: External genitalia:  no lesions              Urethra:  normal appearing urethra with no masses, tenderness or lesions              Bartholins and Skenes: normal                 Vagina: normal appearing vagina with normal color and discharge, no lesions              Cervix: {CHL AMB PHY EX CERVIX NORM [phone]::"no lesions"}              Bimanual Exam:  Uterus:  {CHL AMB PHY EX UTERUS NORM  [phone]::"normal size, contour, position, consistency, mobility, non-tender"}              Adnexa: {CHL AMB PHY EX ADNEXA NO MASS [phone]::"no mass, fullness, tenderness"}              Rectovaginal: {yes no:[id number]}.  Confirms.              Anus:  normal sphincter tone, no lesions  Chaperone was present for exam.  ASSESSMENT     PLAN    An After Visit Summary was printed and given to the patient.  *** minutes face to face time of which over 50% was spent in counseling.

## 2019-05-16 NOTE — Telephone Encounter (Signed)
Patient's appointment for IUD check was md canceled today. She said she is not sure she needs to reschedule. She said "I am feeling much better and no longer spotting".

## 2019-05-16 NOTE — Telephone Encounter (Signed)
Message left to return call to Triage Nurse at [phone].    

## 2019-06-02 NOTE — Telephone Encounter (Signed)
Following up with pt from phone encounter on 05/12/19. Pt states having some intermittent spotting with IUD, but not enough for a pad change.  Also having bladder sx of " peeing non stop, even with not drinking anything". Denies burning, odor, discharge, pain and cramping. Pt states only can come in on Monday 06/06/19 d/t transportation. Denies earlier appt when offered.OV scheduled for 11/2 at 1030 with Dr Sabra Heck.   Routing to provider for final review. Patient is agreeable to disposition. Will close encounter.

## 2019-06-06 ENCOUNTER — Ambulatory Visit: Payer: Managed Care, Other (non HMO) | Admitting: Obstetrics & Gynecology

## 2019-06-06 ENCOUNTER — Encounter: Payer: Self-pay | Admitting: Obstetrics & Gynecology

## 2019-06-06 ENCOUNTER — Other Ambulatory Visit: Payer: Self-pay

## 2019-06-06 VITALS — BP 125/82 | HR 80 | Temp 97.6°F | Wt 138.4 lb

## 2019-06-06 DIAGNOSIS — N926 Irregular menstruation, unspecified: Secondary | ICD-10-CM

## 2019-06-06 DIAGNOSIS — E0590 Thyrotoxicosis, unspecified without thyrotoxic crisis or storm: Secondary | ICD-10-CM | POA: Insufficient documentation

## 2019-06-06 DIAGNOSIS — R350 Frequency of micturition: Secondary | ICD-10-CM

## 2019-06-06 LAB — POCT URINALYSIS DIPSTICK OB
Bilirubin, UA: NEGATIVE
Glucose, UA: NEGATIVE
Ketones, UA: NEGATIVE
Leukocytes, UA: NEGATIVE
POC,PROTEIN,UA: NEGATIVE
Urobilinogen, UA: 0.2 E.U./dL
pH, UA: 6.5 (ref 5.0–8.0)

## 2019-06-06 NOTE — Progress Notes (Signed)
GYNECOLOGY  VISIT  CC:   Irregular bleeding and urinary frequency/urgency  HPI: 53 y.o. G4P4004 Married White or Caucasian female here for bladder symptoms and spotting that started Oct 4th.  Reports she's having more urinary urgency and urinary frequency.  She's having some spotting as well.  She hadn't had bleeding in many months until early October.  She does not try to feel her strings.  She is not having any back pain.   She was diagnosed with hyperthyroidism.  She's having a lot of fatigue, voice hoarseness as well a body pains.  The bleeding and urinary issues started around the same time as she was diagnosed with hyperthyroidism and started on methimazole.    GYNECOLOGIC HISTORY: No LMP recorded. (Menstrual status: IUD). Contraception: Mirena IUD Menopausal hormone therapy: n/a Urine dip: trace hgb  Patient Active Problem List   Diagnosis Date Noted  . History of DVT (deep vein thrombosis) 08/03/2018  . IDA (iron deficiency anemia) 03/18/2017  . 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 Back pain with radiation 12/15/2012  . 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 Seasonal allergic rhinitis 11/22/2012  . Fatigue 11/22/2012  . Plantar wart of right foot 11/22/2012  . Stress at home 11/22/2012    Past Medical History:  Diagnosis Date  . Allergy   . Asthma   . Clotting disorder (HCC)    DVT  . Iron deficiency anemia   . Menorrhagia with regular cycle   . Vitamin D deficiency     Past Surgical History:  Procedure Laterality Date  . TUBAL LIGATION      MEDS:   Current Outpatient Medications on File Prior to Visit  Medication Sig Dispense Refill  . aspirin 325 MG tablet Take 325 mg by mouth daily.    . Probiotic Product (PROBIOTIC-10 PO) Take by mouth daily.     No current facility-administered medications on file prior to visit.     ALLERGIES: Clarithromycin, Doxycycline, Prednisone, and Amoxicillin  Family History  Problem Relation Age of  Onset  . Heart disease Mother   . Heart disease Father   . Kidney cancer Father     SH:  Married, non smoker  Review of Systems  Genitourinary: Positive for frequency and vaginal bleeding.       Spotting from cycle  All other systems reviewed and are negative.   PHYSICAL EXAMINATION:    BP 125/82   Pulse 80   Temp 97.6 F (36.4 C)   Wt 138 lb 6.4 oz (62.8 kg)   BMI 23.03 kg/m     General appearance: alert, cooperative and appears stated age Abdomen: soft, non-tender; bowel sounds normal; no masses,  no organomegaly Lymph:  no inguinal LAD noted  Pelvic: External genitalia:  no lesions              Urethra:  normal appearing urethra with no masses, tenderness or lesions              Bartholins and Skenes: normal                 Vagina: normal appearing vagina with normal color and discharge, no lesions              Cervix: no lesions and IUD string 2cm              Bimanual Exam:  Uterus:  normal size, contour, position, consistency, mobility, non-tender  Adnexa: no mass, fullness, tenderness   Chaperone was present for exam.  Assessment: Irregular bleeding in pt with Mirena IUD for treatment of menorrhagia  Plan: Urine culture pending TSH, Free T4 and Free T3, T4 obtained today.  Will need to sent to endocrinologist [facility] also obtained today   ~15 minutes spent with patient >50% of time was in face to face discussion of above.

## 2019-06-08 LAB — T3: T3, Total: 114 ng/dL (ref 71–180)

## 2019-06-08 LAB — URINE CULTURE

## 2019-06-08 LAB — TSH: TSH: 0.811 u[IU]/mL (ref 0.450–4.500)

## 2019-06-08 LAB — T3, FREE: T3, Free: 2.8 pg/mL (ref 2.0–4.4)

## 2019-06-08 LAB — FOLLICLE STIMULATING HORMONE: FSH: 28.6 m[IU]/mL

## 2019-06-08 LAB — T4, FREE: Free T4: 0.89 ng/dL (ref 0.82–1.77)

## 2019-06-09 ENCOUNTER — Telehealth: Payer: Self-pay | Admitting: Obstetrics & Gynecology

## 2019-06-09 NOTE — Telephone Encounter (Signed)
Patient is calling regarding blood work results.

## 2019-06-09 NOTE — Telephone Encounter (Signed)
Spoke with patient. Advised of all results per Dr. Sabra Heck.  Copy of lab results faxed via Epic to Dr. Shirl Harris at [facility].  Patient states bleeding has completely stopped, no Rx sent. Patient is to notify office if she has any bleeding. Advised I will update Dr. Sabra Heck and call with any additional recommendations. Patient verbalizes understanding and is agreeable.   Routing to provider for final review. Patient is agreeable to disposition. Will close encounter.

## 2019-06-09 NOTE — Telephone Encounter (Addendum)
Notes recorded by Megan Salon, MD on 06/08/2019 at 2:44 PM EST  Please let pt know her urine culture was negative as well. Thanks.    ----- Message from Megan Salon, MD sent at 06/08/2019  1:00 PM EST ----- Please let pt know her thyroid function tests were all good (in my opinion).  She is seeing her endocrinologist in the next two or three weeks.  Does she want [place] to fax these results?  Hopefully she will not need to go have any additional blood testing done.  Her [place] is still in the premenopausal range so she may have some bleeding from time to time.  If still bleeding, I want her to start aygestin 5mg  bid until it stops and then decrease to daily and finish out the bottle that I give her.  She needs to call if this does not stop her bleeding.  Send rx for #30/0RF.  Urine culture is still pending.

## 2019-11-22 NOTE — Telephone Encounter (Signed)
VOID

## 2019-12-09 ENCOUNTER — Telehealth: Payer: Self-pay

## 2019-12-09 NOTE — Telephone Encounter (Signed)
AEX 03/2019 OV 06/2019 Neg UC  Mirena IUD placed 05/2018  Spoke with patient. Pt reports having urinary frequency, urgency and unable to perform daily activities without urinary leaking. Pt states has been getting worse over last 2 months. Denies vaginal discharge, odor or burning, back pain, fever or chills. Pt states has intermittent vaginal "heaviness". Would like recommendations on taking Myrbetriq or something like this. Pt does not see a urologist.  Pt states wanting get urinary problems under control.  Advised will discuss with Dr Hyacinth Meeker and return call to pt. Pt agreeable.   Routing to Dr Hyacinth Meeker.

## 2019-12-09 NOTE — Telephone Encounter (Signed)
Patient is calling in regards to bladder issues. Patient states she can not stand, take a walk, or go outside for long periods of time without needing to use the bathroom. Patient also states she has a bit of pain and pressure in lower vaginal area.

## 2019-12-09 NOTE — Telephone Encounter (Signed)
Ok to use oxybutynin XR24 10mg  daily.  Advise about dry mouth, dry eyes, constipation.  Make sure she has not hx of glaucoma.

## 2019-12-12 MED ORDER — OXYBUTYNIN CHLORIDE ER 10 MG PO TB24: 10.0000 mg | ORAL_TABLET | Freq: Every day | ORAL | 0 refills | Status: DC

## 2019-12-12 NOTE — Telephone Encounter (Signed)
Spoke with pt. Pt given recommendations per Dr Hyacinth Meeker. Pt agreeable and verbalized understanding. Pt denies h/o glaucoma. Pt to use Rx daily and have follow up visit  In 3-4 weeks. Pt agreeable. Pt scheduled for 01/09/20 at 4 pm for follow up with Dr Hyacinth Meeker. Pt verbalized understanding.   Routing to Dr Hyacinth Meeker for review.  Encounter closed.  Rx sent to pharmacy on file. Oxybutynin XR24 10mg  # 30, 0 RF.

## 2019-12-12 NOTE — Telephone Encounter (Signed)
Attempted to call pt, no answer and no voicemail box is set up to leave message. Will try back at a later time.

## 2019-12-12 NOTE — Telephone Encounter (Signed)
[facility] is returning call.

## 2020-01-06 NOTE — Progress Notes (Signed)
GYNECOLOGY  VISIT  CC:   Recheck after starting oxybutynin  HPI: 54 y.o. G64P4004 Married White or Caucasian female here for one month f/u after starting oxybutynin.  Pt reports urgency sensation is essentially resolved.  Denies any issues with dry mouth, dry eyes, or constipation.  Really pleased with symptom improvement.  Now, she is only having issues with incontinence if she coughs, laughs, sneezes or lifts something heavy.  At times, her incontinence is not just leaking but fully emptying bladder.  Feels like, at times, when she starts leaking she just can't stop.  Urine has run down her leg with this type of incontinence.  Occurs when bladder is feeling fully.  Different types of incontinence discussed with pt and specifically OAB and stress incontinence discussed.  Evaluation and treatment for stress incontinence in particular discussed.  Pt is not interested in having any procedures done and is not interested in any surgery.  PT discussed.  She does not like the sound of this and thinks she may have done this in the past, anyway, so declines this as well.    GYNECOLOGIC HISTORY: No LMP recorded. (Menstrual status: IUD). Contraception: iud Menopausal hormone therapy: none  Patient Active Problem List   Diagnosis Date Noted  . OAB (overactive bladder) 01/09/2020  . Hyperthyroidism 06/06/2019  . History of DVT (deep vein thrombosis) 08/03/2018  . IDA (iron deficiency anemia) 03/18/2017  . 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 Back pain with radiation 12/15/2012  . 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 Seasonal allergic rhinitis 11/22/2012  . Fatigue 11/22/2012  . Plantar wart of right foot 11/22/2012  . Stress at home 11/22/2012    Past Medical History:  Diagnosis Date  . Allergy   . Asthma   . History of DVT (deep vein thrombosis)       . History of iron deficiency anemia    resolved after Mirena IUD placement  . Menorrhagia with regular cycle   .  Vitamin D deficiency     Past Surgical History:  Procedure Laterality Date  . TUBAL LIGATION      MEDS:   Current Outpatient Medications on File Prior to Visit  Medication Sig Dispense Refill  . aspirin 325 MG tablet Take 325 mg by mouth daily.    . diclofenac (VOLTAREN) 50 MG EC tablet Take 50 mg by mouth 2 (two) times daily.     No current facility-administered medications on file prior to visit.    ALLERGIES: Clarithromycin, Doxycycline, Prednisone, Sulfa antibiotics, and Amoxicillin  Family History  Problem Relation Age of Onset  . Heart disease Mother   . Heart disease Father   . Kidney cancer Father     SH:  Married, non smoker  Review of Systems  Constitutional: Negative.   HENT: Negative.   Eyes: Negative.   Respiratory: Negative.   Cardiovascular: Negative.   Gastrointestinal: Negative.   Endocrine: Negative.   Genitourinary: Negative.   Musculoskeletal: Negative.   Skin: Negative.   Allergic/Immunologic: Negative.   Neurological: Negative.   Psychiatric/Behavioral: Negative.     PHYSICAL EXAMINATION:    Temp 98.1 F (36.7 C) (Skin)   Wt 141 lb (64 kg)   BMI 23.46 kg/m     General appearance: alert, cooperative and appears stated age No exam performed today  Assessment: OAB Stress incontinence H/o DVT  Plan: Pt will continue the oxybutynin Xl 10mg  daily.  Aware there is an increased dosage if ever feels she needs this.  RF for #90/3RF to pharmacy. Pt declines PT, urodynamics and has no interest in surgery.  Aware I don't have any other suggestions for treatment but is welcome to call back if changes mind.   About 20 minutes total time spent with pt in face to face discussion

## 2020-01-09 ENCOUNTER — Ambulatory Visit: Payer: Managed Care, Other (non HMO) | Admitting: Obstetrics & Gynecology

## 2020-01-09 ENCOUNTER — Encounter: Payer: Self-pay | Admitting: Obstetrics & Gynecology

## 2020-01-09 ENCOUNTER — Other Ambulatory Visit: Payer: Self-pay

## 2020-01-09 VITALS — BP 114/80 | Temp 98.1°F | Wt 141.0 lb

## 2020-01-09 DIAGNOSIS — N3281 Overactive bladder: Secondary | ICD-10-CM | POA: Insufficient documentation

## 2020-01-09 DIAGNOSIS — N393 Stress incontinence (female) (male): Secondary | ICD-10-CM

## 2020-01-09 MED ORDER — OXYBUTYNIN CHLORIDE ER 10 MG PO TB24: 10.0000 mg | ORAL_TABLET | Freq: Every day | ORAL | 3 refills | Status: DC

## 2020-03-19 NOTE — Progress Notes (Deleted)
54 y.o. [id number] Married White or Caucasian female here for annual exam.    No LMP recorded. (Menstrual status: IUD).          Sexually active: {yes no:314532}  The current method of family planning is IUD.   Mirena placed 05-24-18 Exercising: {yes ON:629528}  {types:19826} Smoker:  {YES NO:22349}  Health Maintenance: Pap:  04-12-18 neg HPV HR neg History of abnormal Pap:  no MMG:  05-17-18 category c density birads 1:neg Colonoscopy:  cologuard neg 02/2019 BMD:   none TDaP:  2019 Pneumonia vaccine(s):  no Shingrix:   *** Hep C testing: *** Screening Labs: ***   reports that she has never smoked. She has never used smokeless tobacco. She reports that she does not drink alcohol and does not use drugs.  Past Medical History:  Diagnosis Date  . Allergy   . Asthma   . History of DVT (deep vein thrombosis)       . History of iron deficiency anemia    resolved after Mirena IUD placement  . Menorrhagia with regular cycle   . Vitamin D deficiency     Past Surgical History:  Procedure Laterality Date  . TUBAL LIGATION      Current Outpatient Medications  Medication Sig Dispense Refill  . aspirin 325 MG tablet Take 325 mg by mouth daily.    . diclofenac (VOLTAREN) 50 MG EC tablet Take 50 mg by mouth 2 (two) times daily.     No current facility-administered medications for this visit.    Family History  Problem Relation Age of Onset  . Heart disease Mother   . Heart disease Father   . Kidney cancer Father     Review of Systems  Exam:   There were no vitals taken for this visit.     General appearance: alert, cooperative and appears stated age Head: Normocephalic, without obvious abnormality, atraumatic Neck: no adenopathy, supple, symmetrical, trachea midline and thyroid {EXAM; THYROID:18604} Lungs: clear to auscultation bilaterally Breasts: {Exam; breast:13139::"normal appearance, no masses or tenderness"} Heart: regular rate and rhythm Abdomen: soft, non-tender;  bowel sounds normal; no masses,  no organomegaly Extremities: extremities normal, atraumatic, no cyanosis or edema Skin: Skin color, texture, turgor normal. No rashes or lesions Lymph nodes: Cervical, supraclavicular, and axillary nodes normal. No abnormal inguinal nodes palpated Neurologic: Grossly normal   Pelvic: External genitalia:  no lesions              Urethra:  normal appearing urethra with no masses, tenderness or lesions              Bartholins and Skenes: normal                 Vagina: normal appearing vagina with normal color and discharge, no lesions              Cervix: {exam; cervix:14595}              Pap taken: {yes no:314532} Bimanual Exam:  Uterus:  {exam; uterus:12215}              Adnexa: {exam; adnexa:12223}               Rectovaginal: Confirms               Anus:  normal sphincter tone, no lesions  Chaperone, ***Zenovia Jordan, CMA, was present for exam.  A:  Well Woman with normal exam  P:   {plan; gyn:5269::"mammogram","pap smear","return annually or prn"}

## 2020-03-26 ENCOUNTER — Ambulatory Visit: Payer: Managed Care, Other (non HMO) | Admitting: Obstetrics & Gynecology

## 2020-03-29 NOTE — Progress Notes (Signed)
54 y.o. [id number] Married White or Caucasian female here for annual exam.  Doing well.  Has 6th grandchild on the way.  Due in two weeks  This will be the second granddaughter.  She's very excited about having a new granddaughter.  Denies vaginal bleeding.    On Lyrica for right leg pain.  Having increase fatigue as well.  Has not had any blood work done in last two years except thyroid testing 06/2019.    Has been having increasing pain that goes down the back of her leg that started in the spring.  It hurts all the time.  She cannot bend over due to the pain.  She can walk on her right leg and doesn't limp but it does hurt with walking.  Trying to cross her right leg over the left leg does worsen the pain.  Also, she had some popping and cracking in her right knee.  If crouches down, cannot get up unless has help.  Has been on Lyrica since May from provider at [facility] and this helped but seems to be gradually getting worse.  PCP:  [facility].  Has not had any blood work done this past year.    No LMP recorded. (Menstrual status: IUD).          Sexually active: Yes.    The current method of family planning is tubal ligation and IUD. Mirena placed 05-24-18 Exercising: Yes.    active with grandchildren Smoker:  no  Health Maintenance: Pap:  04-12-18 neg HPV HR neg History of abnormal Pap:  no MMG:  05-18-18 category c density birads 1:neg.  Aware this is due.   Colonoscopy:  cologuard 02-23-2019 neg BMD:   none TDaP:  2019 Pneumonia vaccine(s):  no Shingrix:   no Hep C testing: not done Screening Labs: will have CBC and iron studies done today   reports that she has never smoked. She has never used smokeless tobacco. She reports that she does not drink alcohol and does not use drugs.  Past Medical History:  Diagnosis Date  . Allergy   . Asthma   . History of DVT (deep vein thrombosis)       . History of iron deficiency anemia    resolved after Mirena IUD placement  .  Menorrhagia with regular cycle   . Vitamin D deficiency     Past Surgical History:  Procedure Laterality Date  . TUBAL LIGATION      Current Outpatient Medications  Medication Sig Dispense Refill  . aspirin 325 MG tablet Take 325 mg by mouth daily.    . diclofenac (VOLTAREN) 50 MG EC tablet Take 50 mg by mouth 2 (two) times daily.    . pregabalin (LYRICA) 50 MG capsule Take 50 mg by mouth at bedtime.     No current facility-administered medications for this visit.    Family History  Problem Relation Age of Onset  . Heart disease Mother   . Heart disease Father   . Kidney cancer Father     Review of Systems  Constitutional:       Fatigue  HENT: Negative.   Eyes: Negative.   Respiratory: Negative.   Cardiovascular: Negative.   Gastrointestinal: Negative.   Endocrine: Negative.   Genitourinary: Negative.   Musculoskeletal:       Right leg pain  Skin: Negative.   Allergic/Immunologic: Negative.   Neurological: Negative.   Hematological: Negative.   Psychiatric/Behavioral: Negative.     Exam:  BP 114/74   Pulse 68   Resp 16   Ht 5' 4.75" (1.645 m)   Wt 140 lb (63.5 kg)   BMI 23.48 kg/m   Height: 5' 4.75" (164.5 cm)  General appearance: alert, cooperative and appears stated age Head: Normocephalic, without obvious abnormality, atraumatic Neck: no adenopathy, supple, symmetrical, trachea midline and thyroid normal to inspection and palpation Lungs: clear to auscultation bilaterally Breasts: normal appearance, no masses or tenderness Heart: regular rate and rhythm Abdomen: soft, non-tender; bowel sounds normal; no masses,  no organomegaly Extremities: extremities normal, atraumatic, no cyanosis or edema Skin: Skin color, texture, turgor normal. No rashes or lesions Lymph nodes: Cervical, supraclavicular, and axillary nodes normal. No abnormal inguinal nodes palpated Neurologic: Grossly normal   Pelvic: External genitalia:  no lesions              Urethra:   normal appearing urethra with no masses, tenderness or lesions              Bartholins and Skenes: normal                 Vagina: normal appearing vagina with normal color and discharge, no lesions              Cervix: no lesions, 2cm IUD string noted              Pap taken: No. Bimanual Exam:  Uterus:  normal size, contour, position, consistency, mobility, non-tender              Adnexa: normal adnexa and no mass, fullness, tenderness               Rectovaginal: Confirms               Anus:  normal sphincter tone, no lesions  Chaperone, Delton Coombes, CMA, was present for exam.  A:  Well Woman with normal exam Has Mirena IUD for bleeding control.  New 7 year indication discussed.  Was placed in 2019.  Removal due in 2026. OAB, symptoms currently improved Symptoms c/w right sciatica H/o DVT, now on full ASA Fatigue  P:   Mammogram guidelines reviewed.  Requests that we shcedule for her--Monday am only pap smear with neg HR HPV 2019.  Not indicated today. Referral to ortho made today Cologuard completely 2020 Vaccines reviewed.  She declines shingrix and covid vaccination. CBC, CMP, and iron studies obtained today return annually or prn

## 2020-04-02 ENCOUNTER — Other Ambulatory Visit: Payer: Self-pay | Admitting: Obstetrics & Gynecology

## 2020-04-02 ENCOUNTER — Encounter: Payer: Self-pay | Admitting: Obstetrics & Gynecology

## 2020-04-02 ENCOUNTER — Ambulatory Visit: Payer: Managed Care, Other (non HMO) | Admitting: Obstetrics & Gynecology

## 2020-04-02 ENCOUNTER — Other Ambulatory Visit: Payer: Self-pay

## 2020-04-02 VITALS — BP 114/74 | HR 68 | Resp 16 | Ht 64.75 in | Wt 140.0 lb

## 2020-04-02 DIAGNOSIS — Z01419 Encounter for gynecological examination (general) (routine) without abnormal findings: Secondary | ICD-10-CM | POA: Diagnosis not present

## 2020-04-02 DIAGNOSIS — M5431 Sciatica, right side: Secondary | ICD-10-CM | POA: Diagnosis not present

## 2020-04-02 DIAGNOSIS — Z862 Personal history of diseases of the blood and blood-forming organs and certain disorders involving the immune mechanism: Secondary | ICD-10-CM

## 2020-04-02 DIAGNOSIS — R5383 Other fatigue: Secondary | ICD-10-CM

## 2020-04-02 DIAGNOSIS — Z1231 Encounter for screening mammogram for malignant neoplasm of breast: Secondary | ICD-10-CM

## 2020-04-03 LAB — CBC
Hematocrit: 44.7 % (ref 34.0–46.6)
Hemoglobin: 14.6 g/dL (ref 11.1–15.9)
MCH: 30.4 pg (ref 26.6–33.0)
MCHC: 32.7 g/dL (ref 31.5–35.7)
MCV: 93 fL (ref 79–97)
Platelets: 252 10*3/uL (ref 150–450)
RBC: 4.81 x10E6/uL (ref 3.77–5.28)
RDW: 13.3 % (ref 11.7–15.4)
WBC: 4.9 10*3/uL (ref 3.4–10.8)

## 2020-04-03 LAB — COMPREHENSIVE METABOLIC PANEL
ALT: 16 IU/L (ref 0–32)
AST: 19 IU/L (ref 0–40)
Albumin/Globulin Ratio: 1.9 (ref 1.2–2.2)
Albumin: 4.8 g/dL (ref 3.8–4.9)
Alkaline Phosphatase: 82 IU/L (ref 48–121)
BUN/Creatinine Ratio: 10 (ref 9–23)
BUN: 9 mg/dL (ref 6–24)
Bilirubin Total: 1.1 mg/dL (ref 0.0–1.2)
CO2: 25 mmol/L (ref 20–29)
Calcium: 9.3 mg/dL (ref 8.7–10.2)
Chloride: 102 mmol/L (ref 96–106)
Creatinine, Ser: 0.9 mg/dL (ref 0.57–1.00)
GFR calc Af Amer: 84 mL/min/{1.73_m2} (ref >59)
GFR calc non Af Amer: 73 mL/min/{1.73_m2} (ref >59)
Globulin, Total: 2.5 g/dL (ref 1.5–4.5)
Glucose: 78 mg/dL (ref 65–99)
Potassium: 4.6 mmol/L (ref 3.5–5.2)
Sodium: 140 mmol/L (ref 134–144)
Total Protein: 7.3 g/dL (ref 6.0–8.5)

## 2020-04-03 LAB — IRON,TIBC AND FERRITIN PANEL
Ferritin: 26 ng/mL (ref 15–150)
Iron Saturation: 34 % (ref 15–55)
Iron: 113 ug/dL (ref 27–159)
Total Iron Binding Capacity: 330 ug/dL (ref 250–450)
UIBC: 217 ug/dL (ref 131–425)

## 2020-04-04 ENCOUNTER — Telehealth: Payer: Self-pay | Admitting: Obstetrics & Gynecology

## 2020-04-04 NOTE — Telephone Encounter (Signed)
Spoke with pt. Pt states wanting to know if labs are back from OV on 04/02/20. Pt advised all labs WNL. Pt agreeable and verbalized understanding.  Advised will review with Dr Hyacinth Meeker and return call with any additional recommendations/advice. Pt agreeable.   Routing to Dr Hyacinth Meeker.

## 2020-04-04 NOTE — Telephone Encounter (Signed)
1. Spoke with patient to follow up on her referral to [facility]. She is scheduled for 04/30/20.   2. Patient wants to know if her lab results are back yet.

## 2020-04-30 ENCOUNTER — Ambulatory Visit: Payer: Managed Care, Other (non HMO)

## 2020-04-30 ENCOUNTER — Other Ambulatory Visit: Payer: Self-pay

## 2020-04-30 ENCOUNTER — Ambulatory Visit: Payer: Managed Care, Other (non HMO) | Admitting: Orthopedic Surgery

## 2020-04-30 ENCOUNTER — Encounter: Payer: Self-pay | Admitting: Orthopedic Surgery

## 2020-04-30 VITALS — BP 154/72 | HR 74 | Ht 67.0 in | Wt 140.0 lb

## 2020-04-30 DIAGNOSIS — M545 Low back pain, unspecified: Secondary | ICD-10-CM

## 2020-04-30 DIAGNOSIS — M5410 Radiculopathy, site unspecified: Secondary | ICD-10-CM | POA: Diagnosis not present

## 2020-04-30 MED ORDER — PREGABALIN 50 MG PO CAPS: 50.0000 mg | ORAL_CAPSULE | Freq: Three times a day (TID) | ORAL | 3 refills | Status: DC

## 2020-04-30 NOTE — Patient Instructions (Addendum)
Start physical therapy  Take lyrica 50 mg 3 x a day    Pinched Nerve A pinched nerve is an injury that occurs when too much pressure is placed on a nerve. This pressure can cause pain, burning, and muscle weakness in places that the nerve supplies feeling to, such as an arm, hand, or leg, or the back or neck. If a nerve is severely pinched or has been pinched for a long time, permanent nerve damage can occur. What are the causes? This condition may be caused by:  A nerve passing through a narrow area between bones or other body structures.  Arthritis that causes bones to press on a nerve.  Loss of blood supply to a nerve.  A nerve being stretched from an injury.  A sudden injury with swelling.  Long-term wear on the nerve.  Age-related changes in the spine. What are the signs or symptoms? The most common symptoms of a pinched nerve are feeling a tingling sensation and numbness. Other symptoms include:  Pain that spreads from one area of the body part to another.  A burning feeling.  Muscle weakness. How is this diagnosed? This condition may be diagnosed based on:  A physical exam. During the exam, your health care provider will: ? Check for numbness and muscle weakness. ? Move affected body parts to test for pain.  X-rays to check for bone damage.  A MRI or CT scan to check for conditions that may be causing nerve damage.  A muscle test (electromyogram, EMG) to evaluate how your muscles and nerves communicate. How is this treated?     A pinched nerve is usually treated first by:  Resting the affected body area.  Using devices to help you move without pain (supportive or protective devices), such as a splint, brace, or neck collar. Other treatments depend on your symptoms and the amount of nerve damage you have. Other treatments may include:  Medicines, such as: ? Injections of numbing medicine. ? NSAIDs. ? Pain medicines. ? Steroid medicines. These may be  given as a pill or as an injection.  Physical therapy to relieve pain, maintain movement, and improve muscle strength.  Surgery. This may be done if other treatments do not work. Follow these instructions at home:      Take over-the-counter and prescription medicines only as told by your health care provider.  Wear supportive or protective devices as told by your health care provider.  Do physical therapy exercises as directed.  Ask your health care provider what activities are safe for you.  Rest as needed.  If directed, put ice on the affected area: ? Put ice in a plastic bag. ? Place a towel between your skin and the bag. ? Leave the ice on for 20 minutes, 2-3 times a day.  If directed, apply heat to the affected area. Use the heat source that your health care provider recommends, such as a moist heat pack or a heating pad. ? Place a towel between your skin and the heat source. ? Leave the heat on for 20-30 minutes. ? Remove the heat if your skin turns bright red. This is especially important if you are unable to feel pain, heat, or cold. You may have a greater risk of getting burned.  Do not drive or use heavy machinery while taking prescription pain medicine.  Keep all follow-up visits as told by your health care provider. This is important. Contact a health care provider if:  Your condition does  not improve with treatment.  Your pain, numbness, or weakness suddenly gets worse. Get help right away if you:  Have loss of bladder control (urinary incontinence) or you cannot urinate.  Cannot control bowel movements (fecal incontinence).  Have new weakness in your arms or legs. Summary  A pinched nerve is an injury that occurs when too much pressure is placed on a nerve.  This pressure can cause pain, burning, and muscle weakness in places that the nerve supplies feeling to, such as an arm, hand, or leg, or the back or neck.  Take over-the-counter and prescription  medicines only as told by your health care provider.  Ask your health care provider what activities are safe for you while you are having symptoms. This information is not intended to replace advice given to you by your health care provider. Make sure you discuss any questions you have with your health care provider. Document Revised: 08/07/2017 Document Reviewed: 08/04/2017 Elsevier Patient Education  2020 [employer]  Radicular Pain Radicular pain is a type of pain that spreads from your back or neck along a spinal nerve. Spinal nerves are nerves that leave the spinal cord and go to the muscles. Radicular pain is sometimes called radiculopathy, radiculitis, or a pinched nerve. When you have this type of pain, you may also have weakness, numbness, or tingling in the area of your body that is supplied by the nerve. The pain may feel sharp and burning. Depending on which spinal nerve is affected, the pain may occur in the:  Neck area (cervical radicular pain). You may also feel pain, numbness, weakness, or tingling in the arms.  Mid-spine area (thoracic radicular pain). You would feel this pain in the back and chest. This type is rare.  Lower back area (lumbar radicular pain). You would feel this pain as low back pain. You may feel pain, numbness, weakness, or tingling in the buttocks or legs. Sciatica is a type of lumbar radicular pain that shoots down the back of the leg. Radicular pain occurs when one of the spinal nerves becomes irritated or squeezed (compressed). It is often caused by something pushing on a spinal nerve, such as one of the bones of the spine (vertebrae) or one of the round cushions between vertebrae (intervertebral disks). This can result from:  An injury.  Wear and tear or aging of a disk.  The growth of a bone spur that pushes on the nerve. Radicular pain often goes away when you follow instructions from your health care provider for relieving pain at home. Follow  these instructions at home: Managing pain      If directed, put ice on the affected area: ? Put ice in a plastic bag. ? Place a towel between your skin and the bag. ? Leave the ice on for 20 minutes, 2-3 times a day.  If directed, apply heat to the affected area as often as told by your health care provider. Use the heat source that your health care provider recommends, such as a moist heat pack or a heating pad. ? Place a towel between your skin and the heat source. ? Leave the heat on for 20-30 minutes. ? Remove the heat if your skin turns bright red. This is especially important if you are unable to feel pain, heat, or cold. You may have a greater risk of getting burned. Activity   Do not sit or rest in bed for long periods of time.  Try to stay as active as  possible. Ask your health care provider what type of exercise or activity is best for you.  Avoid activities that make your pain worse, such as bending and lifting.  Do not lift anything that is heavier than 10 lb (4.5 kg), or the limit that you are told, until your health care provider says that it is safe.  Practice using proper technique when lifting items. Proper lifting technique involves bending your knees and rising up.  Do strength and range-of-motion exercises only as told by your health care provider or physical therapist. General instructions  Take over-the-counter and prescription medicines only as told by your health care provider.  Pay attention to any changes in your symptoms.  Keep all follow-up visits as told by your health care provider. This is important. ? Your health care provider may send you to a physical therapist to help with this pain. Contact a health care provider if:  Your pain and other symptoms get worse.  Your pain medicine is not helping.  Your pain has not improved after a few weeks of home care.  You have a fever. Get help right away if:  You have severe pain, weakness, or  numbness.  You have difficulty with bladder or bowel control. Summary  Radicular pain is a type of pain that spreads from your back or neck along a spinal nerve.  When you have radicular pain, you may also have weakness, numbness, or tingling in the area of your body that is supplied by the nerve.  The pain may feel sharp or burning.  Radicular pain may be treated with ice, heat, medicines, or physical therapy. This information is not intended to replace advice given to you by your health care provider. Make sure you discuss any questions you have with your health care provider. Document Revised: 02/02/2018 Document Reviewed: 02/02/2018 Elsevier Patient Education  2020 [employer].

## 2020-04-30 NOTE — Progress Notes (Signed)
NEW PROBLEM//OFFICE VISIT  Chief Complaint  Patient presents with  . Back Pain    into right leg    Assessment and plan  Encounter Diagnoses  Name Primary?  . Lumbar pain Yes  . Radicular pain of right lower extremity    54 year old female with fibromyalgia and right leg sciatica currently on 50 mg of Lyrica and Voltaren. No surgical indications patient should undergo physical therapy increase her Lyrica to 50 mg 3 times a day follow-up as needed  54 year old female history of fibromyalgia back and leg pain for years developed acute onset of pain in the right leg running down to the right foot.  She was put on Voltaren she is on 50 mg of Lyrica for overactive bladder continue to have symptoms. She also says her knees hurt her neck hurts, all of her joint hurt and she has popping and clicking in her knees   Review of Systems  Genitourinary: Positive for frequency.  Musculoskeletal: Positive for joint pain.  Neurological: Positive for sensory change.     Past Medical History:  Diagnosis Date  . Allergy   . Asthma   . History of DVT (deep vein thrombosis)       . History of iron deficiency anemia    resolved after Mirena IUD placement  . Menorrhagia with regular cycle    resolved after Mirena IUD placement  . Vitamin D deficiency     Past Surgical History:  Procedure Laterality Date  . TUBAL LIGATION      Family History  Problem Relation Age of Onset  . Heart disease Mother   . Heart disease Father   . Kidney cancer Father    Social History   Tobacco Use  . Smoking status: Never Smoker  . Smokeless tobacco: Never Used  Vaping Use  . Vaping Use: Never used  Substance Use Topics  . Alcohol use: No  . Drug use: No    Allergies  Allergen Reactions  . Clarithromycin Swelling    Tongue swelling Tongue swelling Tongue swelling  . Doxycycline   . Prednisone Other (See Comments)  . Sulfa Antibiotics   . Amoxicillin Rash    Current Meds  Medication Sig   . aspirin 325 MG tablet Take 325 mg by mouth daily.  . diclofenac (VOLTAREN) 50 MG EC tablet Take 50 mg by mouth 2 (two) times daily.  . pregabalin (LYRICA) 50 MG capsule Take 1 capsule (50 mg total) by mouth 3 (three) times daily.  . [DISCONTINUED] pregabalin (LYRICA) 50 MG capsule Take 50 mg by mouth at bedtime.    BP (!) 154/72   Pulse 74   Ht 5\' 7"  (1.702 m)   Wt 140 lb (63.5 kg)   BMI 21.93 kg/m   Physical Exam  Ortho Exam    MEDICAL DECISION MAKING  A.  Encounter Diagnoses  Name Primary?  . Lumbar pain Yes  . Radicular pain of right lower extremity     B. DATA ANALYSED:   IMAGING: Interpretation of images: Office images show possible spondylolysis of L5-S1 increased lordosis between L4 and S1 and truncal asymmetry consistent with perhaps muscle spasm  Orders: Physical therapy  Outside records reviewed: None   C. MANAGEMENT   As above  Meds ordered this encounter  Medications  . pregabalin (LYRICA) 50 MG capsule    Sig: Take 1 capsule (50 mg total) by mouth 3 (three) times daily.    Dispense:  60 capsule    Refill:  3  Fuller Canada, MD  04/30/2020 9:37 AM

## 2020-04-30 NOTE — Addendum Note (Signed)
Addended byCaffie Damme on: 04/30/2020 09:40 AM   Modules accepted: Orders

## 2020-05-07 ENCOUNTER — Ambulatory Visit: Payer: Managed Care, Other (non HMO) | Attending: Orthopedic Surgery | Admitting: Physical Therapy

## 2020-05-07 ENCOUNTER — Other Ambulatory Visit: Payer: Self-pay

## 2020-05-07 ENCOUNTER — Encounter: Payer: Self-pay | Admitting: Physical Therapy

## 2020-05-07 DIAGNOSIS — G8929 Other chronic pain: Secondary | ICD-10-CM | POA: Diagnosis present

## 2020-05-07 DIAGNOSIS — M5441 Lumbago with sciatica, right side: Secondary | ICD-10-CM | POA: Insufficient documentation

## 2020-05-07 DIAGNOSIS — M6281 Muscle weakness (generalized): Secondary | ICD-10-CM | POA: Insufficient documentation

## 2020-05-07 DIAGNOSIS — M5442 Lumbago with sciatica, left side: Secondary | ICD-10-CM | POA: Diagnosis not present

## 2020-05-07 DIAGNOSIS — R293 Abnormal posture: Secondary | ICD-10-CM | POA: Insufficient documentation

## 2020-05-07 NOTE — Therapy (Signed)
[facility] [address] [place], [place], [address] Phone: [phone]   Fax:  [phone]  Physical Therapy Evaluation  Patient Details  Name: Krystal Mccoy MRN: [id number] Date of Birth: Nov 18, 1965 Referring Provider (PT): Fuller Canada, MD   Encounter Date: 05/07/2020   PT End of Session - 05/07/20 1308    Visit Number 1    Number of Visits 4    Date for PT Re-Evaluation 06/11/20    Authorization Type [employer] (no vaso)    PT Start Time 0945    PT Stop Time 1026    PT Time Calculation (min) 41 min    Activity Tolerance Patient tolerated treatment well;Patient limited by pain    Behavior During Therapy [facility] for tasks assessed/performed           Past Medical History:  Diagnosis Date  . Allergy   . Asthma   . History of DVT (deep vein thrombosis)       . History of iron deficiency anemia    resolved after Mirena IUD placement  . Menorrhagia with regular cycle    resolved after Mirena IUD placement  . Vitamin D deficiency     Past Surgical History:  Procedure Laterality Date  . TUBAL LIGATION      There were no vitals filed for this visit.    Subjective Assessment - 05/07/20 1257    Subjective COVID-19 screening performed upon arrival. Patient arrives to physical therapy with reports of chronic low back pain and pain with standing due to an unknown cause for over 1 year. Patient reports ability to to perform all ADLs but when pain increases, it "takes her breath away". Patient reports pain at worst as 4/10 and pain at best as 0/10. Patient states pain has limited her ability to get down on the floor get back up. Patient's goals are to decrease pain, improve movement, improve strength, and improve ability to perform ADLs and home activities.    Pertinent History Sulfa drug allergy    Limitations House hold activities;Standing    Diagnostic tests x-ray: see imagin    Patient Stated Goals decrease pain    Currently in  Pain? No/denies              [facility] PT Assessment - 05/07/20 0001      Assessment   Medical Diagnosis Lumbar pain, radicular pain of right lower extremity    Referring Provider (PT) Fuller Canada, MD    Onset Date/Surgical Date --   ongoing   Next MD Visit none    Prior Therapy no      Precautions   Precautions None      Restrictions   Weight Bearing Restrictions No      Balance Screen   Has the patient fallen in the past 6 months No    Has the patient had a decrease in activity level because of a fear of falling?  No    Is the patient reluctant to leave their home because of a fear of falling?  No      Home Environment   Living Environment Private residence    Living Arrangements Spouse/significant other      Prior Function   Level of Independence Independent      Posture/Postural Control   Posture/Postural Control Postural limitations    Postural Limitations Rounded Shoulders;Forward head;Flexed trunk      Deep Tendon Reflexes   DTR Assessment Site Patella;Achilles    Patella DTR 1+  Achilles DTR 1+      ROM / Strength   AROM / PROM / Strength AROM;Strength      AROM   Overall AROM  Deficits;Due to pain    AROM Assessment Site Lumbar    Lumbar Flexion 11" finger tip to floor    Lumbar Extension 11 degrees    Lumbar - Right Side Bend 21" finger tip to floor    Lumbar - Left Side Bend 21" finger tip to floor      Strength   Overall Strength Deficits;Due to pain    Overall Strength Comments Pain with R knee extension in R calf    Strength Assessment Site Hip;Knee    Right/Left Hip Right;Left    Right Hip Flexion 3+/5    Right Hip Extension 3-/5    Right Hip ABduction 3-/5    Left Hip Flexion 4-/5    Left Hip Extension 3-/5    Left Hip ABduction 3-/5    Right/Left Knee Right;Left    Right Knee Flexion 4-/5    Right Knee Extension 3+/5    Left Knee Flexion 4-/5    Left Knee Extension 3+/5      Palpation   Palpation comment Very tender to  palpation to bilateral lumbar paraspinals, bilateral QLs, glutes and hamstrings      Special Tests    Special Tests Lumbar    Lumbar Tests Straight Leg Raise      Straight Leg Raise   Findings Positive    Comment (+) bilaterally      Transfers   Comments slow sit to stand transfers, cautious bed mobility with reports of pain      Ambulation/Gait   Gait Pattern Step-through pattern;Decreased stride length;Trunk flexed                      Objective measurements completed on examination: See above findings.               PT Education - 05/07/20 1307    Education Details STKC, DKTC, draw in, bridge; how to progress/regress per tolerance; stop if increase of neurological symptoms    Person(s) Educated Patient    Methods Explanation;Demonstration;Handout    Comprehension Returned demonstration;Verbalized understanding               PT Long Term Goals - 05/07/20 1315      PT LONG TERM GOAL #1   Title Patient will be independent with HEP and its progression    Time 4    Period Weeks    Status New      PT LONG TERM GOAL #2   Title Patient will report a centralization of bilateral neurological symptoms to bilateral glute or report an elimination of neurological symptoms to indicate decreased nerve irritation.    Time 4    Period Weeks    Status New      PT LONG TERM GOAL #3   Title Patient will report ability to perform home tasks and ADLs with low back pain less than or equal to 3/10.    Time 4    Period Weeks    Status New                  Plan - 05/07/20 1309    Clinical Impression Statement Patient is a 54 year old female who presents to physical therapy with chronic low back pain with bilateral neurolgical symptoms to both feet, R more intense than L. Patient  with decreased bilateral LE MMT upon assessment. Trace patella and achilles DTR. Patient very tender to bilateral lumbar paraspinals, glutes, and hamstrings upon palpation.  Patient and PT discussed plan of care and discussed HEP. Due to high copay patient will try HEP then call for another visit for follow up in next week. Patient would benefit from skilled physical therapy to address deficits and address patient's goals.    Personal Factors and Comorbidities Comorbidity 1    Comorbidities sulfa drug allergy    Examination-Activity Limitations Caring for Others;Locomotion Level;Transfers;Stairs;Stand    Examination-Participation Restrictions Laundry;Cleaning;Meal Prep    Stability/Clinical Decision Making Stable/Uncomplicated    Clinical Decision Making Low    Rehab Potential Fair    PT Frequency 1x / week    PT Duration 4 weeks    PT Treatment/Interventions ADLs/Self Care Home Management;Cryotherapy;Electrical Stimulation;Ultrasound;Moist Heat;Gait training;Stair training;Functional mobility training;Therapeutic activities;Therapeutic exercise;Balance training;Neuromuscular re-education;Manual techniques;Passive range of motion;Patient/family education    PT Next Visit Plan Progress HEP as tolerated; nustep, core stability and LE strengthening, body mechanics for home activities. modalities PRN for pain relief    PT Home Exercise Plan see patient education section    Consulted and Agree with Plan of Care Patient           Patient will benefit from skilled therapeutic intervention in order to improve the following deficits and impairments:  Decreased activity tolerance, Decreased strength, Decreased mobility, Postural dysfunction, Pain, Difficulty walking, Decreased range of motion  Visit Diagnosis: Chronic bilateral low back pain with bilateral sciatica - Plan: PT plan of care cert/re-cert  Muscle weakness (generalized) - Plan: PT plan of care cert/re-cert  Abnormal posture - Plan: PT plan of care cert/re-cert     Problem List Patient Active Problem List   Diagnosis Date Noted  . OAB (overactive bladder) 01/09/2020  . Hyperthyroidism 06/06/2019  .  History of DVT (deep vein thrombosis) 08/03/2018  . IDA (iron deficiency anemia) 03/18/2017  . 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 Back pain with radiation 12/15/2012  . 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 Seasonal allergic rhinitis 11/22/2012  . Fatigue 11/22/2012  . Plantar wart of right foot 11/22/2012  . Stress at home 11/22/2012    Guss Bunde, PT, DPT 05/07/2020, 4:11 PM  [facility] [address] [place], [place], [address] Phone: [phone]   Fax:  [phone]  Name: LEMMA TETRO MRN: [id number] Date of Birth: 1966/05/20

## 2020-05-14 ENCOUNTER — Other Ambulatory Visit: Payer: Self-pay

## 2020-05-14 ENCOUNTER — Ambulatory Visit
Admission: RE | Admit: 2020-05-14 | Discharge: 2020-05-14 | Disposition: A | Discharge status: Home / Self Care | Payer: Managed Care, Other (non HMO) | Source: Ambulatory Visit | Attending: Obstetrics & Gynecology | Admitting: Obstetrics & Gynecology

## 2020-05-14 DIAGNOSIS — Z1231 Encounter for screening mammogram for malignant neoplasm of breast: Secondary | ICD-10-CM

## 2020-08-23 ENCOUNTER — Other Ambulatory Visit: Payer: Self-pay

## 2020-08-23 ENCOUNTER — Encounter (HOSPITAL_COMMUNITY): Payer: Self-pay

## 2020-08-23 ENCOUNTER — Emergency Department (HOSPITAL_COMMUNITY)
Admission: EM | Admit: 2020-08-23 | Discharge: 2020-08-23 | Disposition: A | Discharge status: Home / Self Care | Payer: Managed Care, Other (non HMO) | Attending: Emergency Medicine | Admitting: Emergency Medicine

## 2020-08-23 DIAGNOSIS — R42 Dizziness and giddiness: Secondary | ICD-10-CM | POA: Insufficient documentation

## 2020-08-23 DIAGNOSIS — Z5321 Procedure and treatment not carried out due to patient leaving prior to being seen by health care provider: Secondary | ICD-10-CM | POA: Insufficient documentation

## 2020-08-23 DIAGNOSIS — R6883 Chills (without fever): Secondary | ICD-10-CM | POA: Insufficient documentation

## 2020-08-23 DIAGNOSIS — R194 Change in bowel habit: Secondary | ICD-10-CM | POA: Diagnosis present

## 2020-08-23 DIAGNOSIS — R109 Unspecified abdominal pain: Secondary | ICD-10-CM | POA: Insufficient documentation

## 2020-08-23 LAB — COMPREHENSIVE METABOLIC PANEL
ALT: 16 U/L (ref 0–44)
AST: 18 U/L (ref 15–41)
Albumin: 4.5 g/dL (ref 3.5–5.0)
Alkaline Phosphatase: 59 U/L (ref 38–126)
Anion gap: 12 (ref 5–15)
BUN: 9 mg/dL (ref 6–20)
CO2: 24 mmol/L (ref 22–32)
Calcium: 9.6 mg/dL (ref 8.9–10.3)
Chloride: 105 mmol/L (ref 98–111)
Creatinine, Ser: 0.92 mg/dL (ref 0.44–1.00)
GFR, Estimated: >60 mL/min (ref >60)
Glucose, Bld: 85 mg/dL (ref 70–99)
Potassium: 4 mmol/L (ref 3.5–5.1)
Sodium: 141 mmol/L (ref 135–145)
Total Bilirubin: 1.3 mg/dL — ABNORMAL HIGH (ref 0.3–1.2)
Total Protein: 7.5 g/dL (ref 6.5–8.1)

## 2020-08-23 LAB — I-STAT BETA HCG BLOOD, ED (MC, WL, AP ONLY): I-stat hCG, quantitative: <5 m[IU]/mL (ref <5)

## 2020-08-23 LAB — CBC
HCT: 42.8 % (ref 36.0–46.0)
Hemoglobin: 14.5 g/dL (ref 12.0–15.0)
MCH: 32.2 pg (ref 26.0–34.0)
MCHC: 33.9 g/dL (ref 30.0–36.0)
MCV: 94.9 fL (ref 80.0–100.0)
Platelets: 259 10*3/uL (ref 150–400)
RBC: 4.51 MIL/uL (ref 3.87–5.11)
RDW: 13 % (ref 11.5–15.5)
WBC: 8.8 10*3/uL (ref 4.0–10.5)
nRBC: 0 % (ref 0.0–0.2)

## 2020-08-23 LAB — LIPASE, BLOOD: Lipase: 25 U/L (ref 11–51)

## 2020-08-23 NOTE — ED Triage Notes (Signed)
Pt reports frequent bowel movements for a few weeks. Pt denies diarrhea or bloody stools. Pt also denies N/V and abdominal pain. Pt reports her PCP sent her here to been seen by GI.

## 2020-08-23 NOTE — ED Notes (Signed)
Pt turned in pt labels and left

## 2020-10-08 ENCOUNTER — Other Ambulatory Visit: Payer: Self-pay | Admitting: Physician Assistant

## 2020-10-08 DIAGNOSIS — R5382 Chronic fatigue, unspecified: Secondary | ICD-10-CM

## 2020-10-08 DIAGNOSIS — R194 Change in bowel habit: Secondary | ICD-10-CM

## 2020-10-08 DIAGNOSIS — R634 Abnormal weight loss: Secondary | ICD-10-CM

## 2020-10-16 ENCOUNTER — Other Ambulatory Visit: Payer: Managed Care, Other (non HMO)

## 2021-01-04 ENCOUNTER — Other Ambulatory Visit: Payer: Self-pay | Admitting: Obstetrics & Gynecology

## 2021-04-08 ENCOUNTER — Other Ambulatory Visit (HOSPITAL_BASED_OUTPATIENT_CLINIC_OR_DEPARTMENT_OTHER): Payer: Self-pay | Admitting: Obstetrics & Gynecology

## 2021-04-15 ENCOUNTER — Ambulatory Visit (HOSPITAL_BASED_OUTPATIENT_CLINIC_OR_DEPARTMENT_OTHER): Payer: Managed Care, Other (non HMO) | Admitting: Obstetrics & Gynecology

## 2021-06-24 ENCOUNTER — Other Ambulatory Visit (HOSPITAL_BASED_OUTPATIENT_CLINIC_OR_DEPARTMENT_OTHER): Payer: Self-pay | Admitting: Obstetrics & Gynecology

## 2021-07-08 ENCOUNTER — Ambulatory Visit (HOSPITAL_BASED_OUTPATIENT_CLINIC_OR_DEPARTMENT_OTHER): Payer: Managed Care, Other (non HMO) | Admitting: Obstetrics & Gynecology

## 2021-09-09 ENCOUNTER — Other Ambulatory Visit (HOSPITAL_BASED_OUTPATIENT_CLINIC_OR_DEPARTMENT_OTHER): Payer: Self-pay | Admitting: Obstetrics & Gynecology

## 2022-03-12 ENCOUNTER — Encounter: Payer: Self-pay | Admitting: Family

## 2022-03-26 ENCOUNTER — Encounter: Payer: Self-pay | Admitting: Family

## 2022-04-02 ENCOUNTER — Ambulatory Visit (INDEPENDENT_AMBULATORY_CARE_PROVIDER_SITE_OTHER): Payer: 59 | Admitting: Obstetrics & Gynecology

## 2022-04-02 ENCOUNTER — Encounter: Payer: Self-pay | Admitting: Family

## 2022-04-02 ENCOUNTER — Encounter (HOSPITAL_BASED_OUTPATIENT_CLINIC_OR_DEPARTMENT_OTHER): Payer: Self-pay | Admitting: Obstetrics & Gynecology

## 2022-04-02 ENCOUNTER — Other Ambulatory Visit (HOSPITAL_COMMUNITY)
Admission: RE | Admit: 2022-04-02 | Discharge: 2022-04-02 | Disposition: A | Discharge status: Home / Self Care | Payer: 59 | Source: Ambulatory Visit | Attending: Obstetrics & Gynecology | Admitting: Obstetrics & Gynecology

## 2022-04-02 VITALS — BP 135/72 | HR 75 | Ht 67.0 in | Wt 138.6 lb

## 2022-04-02 DIAGNOSIS — Z86718 Personal history of other venous thrombosis and embolism: Secondary | ICD-10-CM | POA: Diagnosis not present

## 2022-04-02 DIAGNOSIS — N3281 Overactive bladder: Secondary | ICD-10-CM | POA: Diagnosis not present

## 2022-04-02 DIAGNOSIS — Z01419 Encounter for gynecological examination (general) (routine) without abnormal findings: Secondary | ICD-10-CM | POA: Diagnosis not present

## 2022-04-02 DIAGNOSIS — Z124 Encounter for screening for malignant neoplasm of cervix: Secondary | ICD-10-CM

## 2022-04-02 DIAGNOSIS — Z975 Presence of (intrauterine) contraceptive device: Secondary | ICD-10-CM

## 2022-04-02 NOTE — Progress Notes (Signed)
56 y.o. [id number] Married White or Caucasian female here for annual exam.  Doing well.  Denies vaginal bleeding.   No LMP recorded. (Menstrual status: IUD).          Sexually active: Yes.    The current method of family planning is post menopausal status.    Exercising: no Smoker:  no  Health Maintenance: Pap:  04/12/2018 Negative History of abnormal Pap:  no MMG:  05/14/2020 Negative Colonoscopy:  2020.  Will repeat this year. BMD:   not indicated Screening Labs: does with PCP, Dwyane Luo   reports that she has never smoked. She has never used smokeless tobacco. She reports that she does not drink alcohol and does not use drugs.  Past Medical History:  Diagnosis Date   Allergy    Asthma    History of DVT (deep vein thrombosis)        History of iron deficiency anemia    resolved after Mirena IUD placement   Menorrhagia with regular cycle    resolved after Mirena IUD placement   Vitamin D deficiency     Past Surgical History:  Procedure Laterality Date   TUBAL LIGATION      Current Outpatient Medications  Medication Sig Dispense Refill   aspirin 325 MG tablet Take 325 mg by mouth daily.     montelukast (SINGULAIR) 10 MG tablet Take 10 mg by mouth at bedtime.     oxybutynin (DITROPAN-XL) 10 MG 24 hr tablet TAKE ONE TABLET BY MOUTH EVERY NIGHT AT BEDTIME 90 tablet 0   No current facility-administered medications for this visit.    Family History  Problem Relation Age of Onset   Heart disease Mother    Heart disease Father    Kidney cancer Father     ROS: Constitutional: negative Genitourinary:negative  Exam:   BP 135/72 (BP Location: Right Arm, Patient Position: Sitting, Cuff Size: Large)   Pulse 75   Ht 5\' 7"  (1.702 m) Comment: Reported  Wt 138 lb 9.6 oz (62.9 kg)   BMI 21.71 kg/m   Height: 5\' 7"  (170.2 cm) (Reported)  General appearance: alert, cooperative and appears stated age Head: Normocephalic, without obvious abnormality, atraumatic Neck: no  adenopathy, supple, symmetrical, trachea midline and thyroid normal to inspection and palpation Lungs: clear to auscultation bilaterally Breasts: normal appearance, no masses or tenderness Heart: regular rate and rhythm Abdomen: soft, non-tender; bowel sounds normal; no masses,  no organomegaly Extremities: extremities normal, atraumatic, no cyanosis or edema Skin: Skin color, texture, turgor normal. No rashes or lesions Lymph nodes: Cervical, supraclavicular, and axillary nodes normal. No abnormal inguinal nodes palpated Neurologic: Grossly normal   Pelvic: External genitalia:  no lesions              Urethra:  normal appearing urethra with no masses, tenderness or lesions              Bartholins and Skenes: normal                 Vagina: normal appearing vagina with normal color and no discharge, no lesions              Cervix: no lesions, IUD string noted and about 2cm              Pap taken: Yes.   Bimanual Exam:  Uterus:  normal size, contour, position, consistency, mobility, non-tender              Adnexa: normal adnexa and no mass,  fullness, tenderness               Rectovaginal: Confirms               Anus:  normal sphincter tone, no lesions  Chaperone, Ina Homes, CMA, was present for exam.  Assessment/Plan: 1. Well woman exam with routine gynecological exam - Pap smear obtained today - Mammogram is due.  She will let me know when has new insurance and I can help getting this scheduled - Colonoscopy has been declined but she wants to wait until has new insurance - Bone mineral density not indicated - lab work done done with PCP - vaccines reviewed/updated.  Declines shingrix vaccine.  2. Cervical cancer screening - Cytology - PAP( [facility])  3. OAB (overactive bladder) - on oxybutynin.  Does not need RF.  4. History of DVT (deep vein thrombosis)  5.  H/o menorrhagia with IUD in place - 8 year indication discussed.

## 2022-04-08 DIAGNOSIS — M25562 Pain in left knee: Secondary | ICD-10-CM | POA: Diagnosis not present

## 2022-04-08 DIAGNOSIS — M25561 Pain in right knee: Secondary | ICD-10-CM | POA: Diagnosis not present

## 2022-04-08 LAB — CYTOLOGY - PAP
Comment: NEGATIVE
Diagnosis: NEGATIVE
High risk HPV: NEGATIVE

## 2022-05-08 DIAGNOSIS — M170 Bilateral primary osteoarthritis of knee: Secondary | ICD-10-CM | POA: Diagnosis not present

## 2022-05-22 DIAGNOSIS — M170 Bilateral primary osteoarthritis of knee: Secondary | ICD-10-CM | POA: Diagnosis not present

## 2022-05-29 DIAGNOSIS — M170 Bilateral primary osteoarthritis of knee: Secondary | ICD-10-CM | POA: Diagnosis not present

## 2022-06-02 ENCOUNTER — Encounter: Payer: Self-pay | Admitting: Family

## 2022-06-03 ENCOUNTER — Encounter: Payer: Self-pay | Admitting: Family

## 2022-06-05 DIAGNOSIS — M170 Bilateral primary osteoarthritis of knee: Secondary | ICD-10-CM | POA: Diagnosis not present

## 2022-06-29 ENCOUNTER — Emergency Department (HOSPITAL_BASED_OUTPATIENT_CLINIC_OR_DEPARTMENT_OTHER)
Admission: EM | Admit: 2022-06-29 | Discharge: 2022-06-30 | Disposition: A | Discharge status: Home / Self Care | Payer: 59 | Attending: Emergency Medicine | Admitting: Emergency Medicine

## 2022-06-29 ENCOUNTER — Emergency Department (HOSPITAL_BASED_OUTPATIENT_CLINIC_OR_DEPARTMENT_OTHER): Payer: 59

## 2022-06-29 ENCOUNTER — Encounter: Payer: Self-pay | Admitting: Family

## 2022-06-29 ENCOUNTER — Other Ambulatory Visit: Payer: Self-pay

## 2022-06-29 DIAGNOSIS — R531 Weakness: Secondary | ICD-10-CM | POA: Diagnosis not present

## 2022-06-29 DIAGNOSIS — Z7982 Long term (current) use of aspirin: Secondary | ICD-10-CM | POA: Diagnosis not present

## 2022-06-29 DIAGNOSIS — R059 Cough, unspecified: Secondary | ICD-10-CM | POA: Diagnosis not present

## 2022-06-29 DIAGNOSIS — R55 Syncope and collapse: Secondary | ICD-10-CM | POA: Insufficient documentation

## 2022-06-29 DIAGNOSIS — R42 Dizziness and giddiness: Secondary | ICD-10-CM | POA: Diagnosis not present

## 2022-06-29 DIAGNOSIS — R7401 Elevation of levels of liver transaminase levels: Secondary | ICD-10-CM | POA: Diagnosis not present

## 2022-06-29 DIAGNOSIS — U071 COVID-19: Secondary | ICD-10-CM | POA: Insufficient documentation

## 2022-06-29 DIAGNOSIS — E876 Hypokalemia: Secondary | ICD-10-CM | POA: Diagnosis not present

## 2022-06-29 DIAGNOSIS — R000 Tachycardia, unspecified: Secondary | ICD-10-CM | POA: Diagnosis not present

## 2022-06-29 LAB — COMPREHENSIVE METABOLIC PANEL
ALT: 14 U/L (ref 0–44)
AST: 16 U/L (ref 15–41)
Albumin: 4.6 g/dL (ref 3.5–5.0)
Alkaline Phosphatase: 101 U/L (ref 38–126)
Anion gap: 12 (ref 5–15)
BUN: 11 mg/dL (ref 6–20)
CO2: 24 mmol/L (ref 22–32)
Calcium: 9.3 mg/dL (ref 8.9–10.3)
Chloride: 102 mmol/L (ref 98–111)
Creatinine, Ser: 0.82 mg/dL (ref 0.44–1.00)
GFR, Estimated: >60 mL/min (ref >60)
Glucose, Bld: 124 mg/dL — ABNORMAL HIGH (ref 70–99)
Potassium: 3.4 mmol/L — ABNORMAL LOW (ref 3.5–5.1)
Sodium: 138 mmol/L (ref 135–145)
Total Bilirubin: 0.7 mg/dL (ref 0.3–1.2)
Total Protein: 7.3 g/dL (ref 6.5–8.1)

## 2022-06-29 LAB — URINALYSIS, ROUTINE W REFLEX MICROSCOPIC
Bilirubin Urine: NEGATIVE
Glucose, UA: NEGATIVE mg/dL
Ketones, ur: NEGATIVE mg/dL
Nitrite: NEGATIVE
Specific Gravity, Urine: 1.025 (ref 1.005–1.030)
pH: 6 (ref 5.0–8.0)

## 2022-06-29 LAB — CBC WITH DIFFERENTIAL/PLATELET
Abs Immature Granulocytes: 0.02 10*3/uL (ref 0.00–0.07)
Basophils Absolute: 0.1 10*3/uL (ref 0.0–0.1)
Basophils Relative: 1 %
Eosinophils Absolute: 0.1 10*3/uL (ref 0.0–0.5)
Eosinophils Relative: 1 %
HCT: 39.9 % (ref 36.0–46.0)
Hemoglobin: 13.7 g/dL (ref 12.0–15.0)
Immature Granulocytes: 0 %
Lymphocytes Relative: 7 %
Lymphs Abs: 0.7 10*3/uL (ref 0.7–4.0)
MCH: 31.1 pg (ref 26.0–34.0)
MCHC: 34.3 g/dL (ref 30.0–36.0)
MCV: 90.5 fL (ref 80.0–100.0)
Monocytes Absolute: 1 10*3/uL (ref 0.1–1.0)
Monocytes Relative: 11 %
Neutro Abs: 7 10*3/uL (ref 1.7–7.7)
Neutrophils Relative %: 80 %
Platelets: 244 10*3/uL (ref 150–400)
RBC: 4.41 MIL/uL (ref 3.87–5.11)
RDW: 12.3 % (ref 11.5–15.5)
WBC: 8.9 10*3/uL (ref 4.0–10.5)
nRBC: 0 % (ref 0.0–0.2)

## 2022-06-29 LAB — PROTIME-INR
INR: 1.2 (ref 0.8–1.2)
Prothrombin Time: 14.6 seconds (ref 11.4–15.2)

## 2022-06-29 LAB — LACTIC ACID, PLASMA: Lactic Acid, Venous: 2.3 mmol/L (ref 0.5–1.9)

## 2022-06-29 LAB — RESP PANEL BY RT-PCR (FLU A&B, COVID) ARPGX2
Influenza A by PCR: NEGATIVE
Influenza B by PCR: NEGATIVE
SARS Coronavirus 2 by RT PCR: POSITIVE — AB

## 2022-06-29 LAB — APTT: aPTT: 27 seconds (ref 24–36)

## 2022-06-29 MED ORDER — LACTATED RINGERS IV SOLN: INTRAVENOUS | Status: DC

## 2022-06-29 MED ORDER — ACETAMINOPHEN 325 MG PO TABS
650.0000 mg | ORAL_TABLET | Freq: Once | ORAL | Status: AC | PRN
  Administered 2022-06-29: 650 mg via ORAL
  Filled 2022-06-29: qty 2

## 2022-06-29 MED ORDER — LACTATED RINGERS IV BOLUS (SEPSIS)
1000.0000 mL | Freq: Once | INTRAVENOUS | Status: AC
  Administered 2022-06-29: 1000 mL via INTRAVENOUS

## 2022-06-29 NOTE — ED Triage Notes (Signed)
Pt from home via pov with "heart racing," dizziness, cough x 2 days. Pt reports that she felt like this 2 years ago and had covid and pneumonia. Pt also reports hot flashes today. Pt alert & oriented, nad noted.

## 2022-06-29 NOTE — ED Notes (Signed)
Dr. Charm Barges aware of 2.3 lactic

## 2022-06-29 NOTE — ED Provider Notes (Signed)
[facility] EMERGENCY DEPT Provider Note   CSN: [id number] Arrival date & time: 06/29/22  2106     History  Chief Complaint  Patient presents with   Dizziness   Cough    Krystal Mccoy is a 57 y.o. female.  She is here with complaint of feeling hot and cold going on for 2 days.  She feels dizzy like she might pass out.  She has had a nonproductive cough.  She did not know that she had a fever.  She has had some chills.  She said this feels like when she had COVID and pneumonia 2 years ago.  She denies any headache blurry vision chest pain abdominal pain nausea vomiting diarrhea or urinary symptoms.  No sick contacts or recent travel.  She said she had a remote history of a DVT 20 years ago and this did not feel anything like it.  The history is provided by the patient.  Dizziness Quality:  Lightheadedness Severity:  Severe Onset quality:  Gradual Duration:  2 days Timing:  Intermittent Progression:  Unchanged Chronicity:  New Relieved by:  Nothing Worsened by:  Nothing Ineffective treatments:  None tried Associated symptoms: palpitations and weakness   Associated symptoms: no chest pain, no diarrhea, no headaches, no nausea, no shortness of breath, no tinnitus, no vision changes and no vomiting   Cough Associated symptoms: chills and fever   Associated symptoms: no chest pain, no headaches, no myalgias, no rash and no shortness of breath        Home Medications Prior to Admission medications   Medication Sig Start Date End Date Taking? Authorizing Provider  aspirin 325 MG tablet Take 325 mg by mouth daily.    [provider]  montelukast (SINGULAIR) 10 MG tablet Take 10 mg by mouth at bedtime.    [provider]  oxybutynin (DITROPAN-XL) 10 MG 24 hr tablet TAKE ONE TABLET BY MOUTH EVERY NIGHT AT BEDTIME 06/24/21   Jerene Bears, MD      Allergies    Clarithromycin, Doxycycline, Prednisone, Sulfa antibiotics, and Amoxicillin     Review of Systems   Review of Systems  Constitutional:  Positive for chills, fatigue and fever.  HENT:  Negative for tinnitus.   Eyes:  Negative for visual disturbance.  Respiratory:  Positive for cough. Negative for shortness of breath.   Cardiovascular:  Positive for palpitations. Negative for chest pain.  Gastrointestinal:  Negative for diarrhea, nausea and vomiting.  Genitourinary:  Negative for dysuria.  Musculoskeletal:  Negative for myalgias.  Skin:  Negative for rash.  Neurological:  Positive for dizziness, weakness and light-headedness. Negative for headaches.    Physical Exam Updated Vital Signs BP (!) 158/78 (BP Location: Right Arm)   Pulse (!) 122   Temp (!) 100.4 F (38 C) (Oral)   Resp 18   Ht 5\' 7"  (1.702 m)   Wt 63.5 kg   SpO2 99%   BMI 21.93 kg/m  Physical Exam Vitals and nursing note reviewed.  Constitutional:      General: She is not in acute distress.    Appearance: Normal appearance. She is well-developed.  HENT:     Head: Normocephalic and atraumatic.     Nose: Nose normal.     Mouth/Throat:     Mouth: Mucous membranes are moist.     Pharynx: Oropharynx is clear.  Eyes:     Conjunctiva/sclera: Conjunctivae normal.  Cardiovascular:     Rate and Rhythm: Regular rhythm. Tachycardia present.  Heart sounds: No murmur heard. Pulmonary:     Effort: Pulmonary effort is normal. No respiratory distress.     Breath sounds: Normal breath sounds.  Abdominal:     Palpations: Abdomen is soft.     Tenderness: There is no abdominal tenderness. There is no guarding or rebound.  Musculoskeletal:        General: No deformity. Normal range of motion.     Cervical back: Neck supple.  Skin:    General: Skin is warm and dry.     Capillary Refill: Capillary refill takes less than 2 seconds.     Findings: No rash.  Neurological:     General: No focal deficit present.     Mental Status: She is alert.     Sensory: No sensory deficit.     Motor: No weakness.      ED Results / Procedures / Treatments   Labs (all labs ordered are listed, but only abnormal results are displayed) Labs Reviewed  RESP PANEL BY RT-PCR (FLU A&B, COVID) ARPGX2 - Abnormal; Notable for the following components:      Result Value   SARS Coronavirus 2 by RT PCR POSITIVE (*)    All other components within normal limits  URINALYSIS, ROUTINE W REFLEX MICROSCOPIC - Abnormal; Notable for the following components:   Hgb urine dipstick TRACE (*)    Protein, ur TRACE (*)    Leukocytes,Ua MODERATE (*)    Non Squamous Epithelial 0-5 (*)    All other components within normal limits  LACTIC ACID, PLASMA - Abnormal; Notable for the following components:   Lactic Acid, Venous 2.3 (*)    All other components within normal limits  COMPREHENSIVE METABOLIC PANEL - Abnormal; Notable for the following components:   Potassium 3.4 (*)    Glucose, Bld 124 (*)    All other components within normal limits  CULTURE, BLOOD (ROUTINE X 2)  CULTURE, BLOOD (ROUTINE X 2)  URINE CULTURE  PROTIME-INR  APTT  CBC WITH DIFFERENTIAL/PLATELET    EKG EKG Interpretation  Date/Time:  Sunday June 29 2022 21:42:58 EST Ventricular Rate:  111 PR Interval:  131 QRS Duration: 93 QT Interval:  320 QTC Calculation: 435 R Axis:   87 Text Interpretation: Sinus tachycardia No significant change since prior 7/03 Confirmed by Meridee Score [phone]) on 06/29/2022 9:50:10 PM  Radiology DG Chest Port 1 View  Result Date: 06/29/2022 CLINICAL DATA:  Heart racing, dizziness, cough EXAM: PORTABLE CHEST 1 VIEW COMPARISON:  Radiograph 06/06/2020 FINDINGS: The heart size and mediastinal contours are within normal limits. Both lungs are clear. The visualized skeletal structures are unremarkable. IMPRESSION: No active disease. Electronically Signed   By: Minerva Fester M.D.   On: 06/29/2022 22:09    Procedures Procedures    Medications Ordered in ED Medications  acetaminophen (TYLENOL) tablet 650 mg (650  mg Oral Given 06/29/22 2306)  lactated ringers bolus 1,000 mL (0 mLs Intravenous Stopped 06/30/22 0030)    ED Course/ Medical Decision Making/ A&P Clinical Course as of 06/30/22 0941  Sun Jun 29, 2022  2212 Chest x-ray interpreted by me as no acute infiltrates.  Awaiting radiology reading. [MB]  2247 Patient's COVID test came back positive.  She is quite distraught by this as it required hospitalization during the last time she had COVID. [MB]    Clinical Course User Index [MB] Terrilee Files, MD  Medical Decision Making Amount and/or Complexity of Data Reviewed Labs: ordered. Radiology: ordered.  Risk OTC drugs. Prescription drug management.   This patient complains of cough feeling hot and cold dizzy lightheaded near syncope; this involves an extensive number of treatment Options and is a complaint that carries with it a high risk of complications and morbidity. The differential includes infection including pneumonia, COVID, flu, dehydration, arrhythmia, metabolic derangement  I ordered, reviewed and interpreted labs, which included CBC with normal white count normal hemoglobin, chemistries fairly unremarkable mildly low potassium, lactate elevated, urinalysis without clear signs of infection, COVID test positive I ordered medication IV fluids and Tylenol and reviewed PMP when indicated. I ordered imaging studies which included chest x-ray and I independently    visualized and interpreted imaging which showed no clear infiltrate Additional history obtained from significant other Previous records obtained and reviewed in epic no recent admissions Cardiac monitoring reviewed, normal sinus rhythm Social determinants considered, no significant barriers Critical Interventions: None  After the interventions stated above, I reevaluated the patient and found patient is symptomatically improving.  She is not requiring oxygen Admission and further testing  considered, her care is signed out to Dr. Wilkie Aye to follow-up on response to IV fluids.  If she is feeling better she likely can be discharged with appropriate treatment.         Final Clinical Impression(s) / ED Diagnoses Final diagnoses:  Near syncope  COVID-19 virus infection    Rx / DC Orders ED Discharge Orders     None         Terrilee Files, MD 06/30/22 [phone]

## 2022-06-30 MED ORDER — NIRMATRELVIR/RITONAVIR (PAXLOVID)TABLET: 3.0000 | ORAL_TABLET | Freq: Two times a day (BID) | ORAL | 0 refills | Status: AC

## 2022-06-30 NOTE — ED Provider Notes (Signed)
Patient signed out pending hydration and urinalysis.  Urinalysis without overt signs of infection.  Patient COVID-positive.  She feels much better after 1 L of fluids.  She has a history of asthma.  Will prescribe Paxlovid.  She was given return precautions.  Patient discharged per Dr. Silvana Newness discharge instructions.  Physical Exam  BP 137/67   Pulse 92   Temp (!) 100.4 F (38 C) (Oral)   Resp (!) 27   Ht 1.702 m (5\' 7" )   Wt 63.5 kg   SpO2 98%   BMI 21.93 kg/m   Procedures  Procedures  ED Course / MDM   Clinical Course as of 06/30/22 0058  Sun Jun 29, 2022  2212 Chest x-ray interpreted by me as no acute infiltrates.  Awaiting radiology reading. [MB]  2247 Patient's COVID test came back positive.  She is quite distraught by this as it required hospitalization during the last time she had COVID. [MB]    Clinical Course User Index [MB] 2248, MD   Medical Decision Making Amount and/or Complexity of Data Reviewed Labs: ordered. Radiology: ordered.  Risk OTC drugs. Prescription drug management.   Problem List Items Addressed This Visit   None Visit Diagnoses     Near syncope    -  Primary   COVID-19 virus infection       Relevant Medications   nirmatrelvir/ritonavir EUA (PAXLOVID) 20 x 150 MG & 10 x 100MG  TABS             Wanetta Funderburke, Terrilee Files, MD 06/30/22 0100

## 2022-06-30 NOTE — Discharge Instructions (Addendum)
You were seen today for near syncope and fever.  You tested positive for COVID-19.  Make sure to stay hydrated.  You were given a prescription for Paxlovid given your history of asthma.  If you develop any new or worsening symptoms, you should be reevaluated.

## 2022-07-01 LAB — URINE CULTURE

## 2022-07-04 DIAGNOSIS — Z419 Encounter for procedure for purposes other than remedying health state, unspecified: Secondary | ICD-10-CM | POA: Diagnosis not present

## 2022-07-05 LAB — CULTURE, BLOOD (ROUTINE X 2)
Culture: NO GROWTH
Culture: NO GROWTH
Special Requests: ADEQUATE

## 2022-07-08 DIAGNOSIS — J0100 Acute maxillary sinusitis, unspecified: Secondary | ICD-10-CM | POA: Diagnosis not present

## 2022-07-09 ENCOUNTER — Other Ambulatory Visit (HOSPITAL_BASED_OUTPATIENT_CLINIC_OR_DEPARTMENT_OTHER): Payer: Self-pay | Admitting: *Deleted

## 2022-07-09 MED ORDER — OXYBUTYNIN CHLORIDE ER 10 MG PO TB24: 10.0000 mg | ORAL_TABLET | Freq: Every day | ORAL | 0 refills | Status: DC

## 2022-07-10 ENCOUNTER — Other Ambulatory Visit (HOSPITAL_BASED_OUTPATIENT_CLINIC_OR_DEPARTMENT_OTHER): Payer: Self-pay | Admitting: *Deleted

## 2022-07-10 MED ORDER — OXYBUTYNIN CHLORIDE ER 10 MG PO TB24: 10.0000 mg | ORAL_TABLET | Freq: Every day | ORAL | 0 refills | Status: DC

## 2022-08-04 DIAGNOSIS — Z419 Encounter for procedure for purposes other than remedying health state, unspecified: Secondary | ICD-10-CM | POA: Diagnosis not present

## 2022-08-27 ENCOUNTER — Encounter: Payer: Self-pay | Admitting: Family

## 2022-09-03 DIAGNOSIS — J069 Acute upper respiratory infection, unspecified: Secondary | ICD-10-CM | POA: Diagnosis not present

## 2022-09-03 DIAGNOSIS — J329 Chronic sinusitis, unspecified: Secondary | ICD-10-CM | POA: Diagnosis not present

## 2022-09-04 DIAGNOSIS — Z419 Encounter for procedure for purposes other than remedying health state, unspecified: Secondary | ICD-10-CM | POA: Diagnosis not present

## 2022-09-30 ENCOUNTER — Other Ambulatory Visit (HOSPITAL_BASED_OUTPATIENT_CLINIC_OR_DEPARTMENT_OTHER): Payer: Self-pay | Admitting: Obstetrics & Gynecology

## 2022-10-03 DIAGNOSIS — Z419 Encounter for procedure for purposes other than remedying health state, unspecified: Secondary | ICD-10-CM | POA: Diagnosis not present

## 2022-10-23 DIAGNOSIS — J069 Acute upper respiratory infection, unspecified: Secondary | ICD-10-CM | POA: Diagnosis not present

## 2022-11-03 DIAGNOSIS — Z419 Encounter for procedure for purposes other than remedying health state, unspecified: Secondary | ICD-10-CM | POA: Diagnosis not present

## 2022-11-12 DIAGNOSIS — J3089 Other allergic rhinitis: Secondary | ICD-10-CM | POA: Diagnosis not present

## 2022-12-03 DIAGNOSIS — Z419 Encounter for procedure for purposes other than remedying health state, unspecified: Secondary | ICD-10-CM | POA: Diagnosis not present

## 2022-12-08 DIAGNOSIS — J329 Chronic sinusitis, unspecified: Secondary | ICD-10-CM | POA: Diagnosis not present

## 2022-12-08 DIAGNOSIS — R051 Acute cough: Secondary | ICD-10-CM | POA: Diagnosis not present

## 2023-01-02 ENCOUNTER — Other Ambulatory Visit (HOSPITAL_BASED_OUTPATIENT_CLINIC_OR_DEPARTMENT_OTHER): Payer: Self-pay | Admitting: Obstetrics & Gynecology

## 2023-01-03 DIAGNOSIS — Z419 Encounter for procedure for purposes other than remedying health state, unspecified: Secondary | ICD-10-CM | POA: Diagnosis not present

## 2023-02-04 ENCOUNTER — Other Ambulatory Visit (HOSPITAL_BASED_OUTPATIENT_CLINIC_OR_DEPARTMENT_OTHER): Payer: Self-pay | Admitting: Obstetrics & Gynecology

## 2023-02-21 DIAGNOSIS — X58XXXA Exposure to other specified factors, initial encounter: Secondary | ICD-10-CM | POA: Diagnosis not present

## 2023-02-21 DIAGNOSIS — L089 Local infection of the skin and subcutaneous tissue, unspecified: Secondary | ICD-10-CM | POA: Diagnosis not present

## 2023-02-21 DIAGNOSIS — S90411A Abrasion, right great toe, initial encounter: Secondary | ICD-10-CM | POA: Diagnosis not present

## 2023-04-09 ENCOUNTER — Other Ambulatory Visit (HOSPITAL_BASED_OUTPATIENT_CLINIC_OR_DEPARTMENT_OTHER): Payer: Self-pay | Admitting: Obstetrics & Gynecology

## 2023-04-09 ENCOUNTER — Encounter: Payer: Self-pay | Admitting: Family

## 2023-04-16 ENCOUNTER — Ambulatory Visit (HOSPITAL_BASED_OUTPATIENT_CLINIC_OR_DEPARTMENT_OTHER): Payer: 59 | Admitting: Obstetrics & Gynecology

## 2023-08-07 ENCOUNTER — Other Ambulatory Visit (HOSPITAL_BASED_OUTPATIENT_CLINIC_OR_DEPARTMENT_OTHER): Payer: Self-pay | Admitting: Obstetrics & Gynecology

## 2023-09-03 ENCOUNTER — Other Ambulatory Visit (HOSPITAL_BASED_OUTPATIENT_CLINIC_OR_DEPARTMENT_OTHER): Payer: Self-pay | Admitting: Obstetrics & Gynecology

## 2023-09-09 ENCOUNTER — Other Ambulatory Visit (HOSPITAL_BASED_OUTPATIENT_CLINIC_OR_DEPARTMENT_OTHER): Payer: Self-pay | Admitting: Certified Nurse Midwife

## 2023-10-07 ENCOUNTER — Encounter: Payer: Self-pay | Admitting: Family

## 2023-10-09 ENCOUNTER — Ambulatory Visit (INDEPENDENT_AMBULATORY_CARE_PROVIDER_SITE_OTHER): Payer: Self-pay | Admitting: Obstetrics & Gynecology

## 2023-10-09 VITALS — BP 130/75 | HR 82 | Ht 67.0 in | Wt 145.4 lb

## 2023-10-09 DIAGNOSIS — Z975 Presence of (intrauterine) contraceptive device: Secondary | ICD-10-CM

## 2023-10-09 DIAGNOSIS — Z86718 Personal history of other venous thrombosis and embolism: Secondary | ICD-10-CM

## 2023-10-09 DIAGNOSIS — N812 Incomplete uterovaginal prolapse: Secondary | ICD-10-CM

## 2023-10-11 ENCOUNTER — Encounter (HOSPITAL_BASED_OUTPATIENT_CLINIC_OR_DEPARTMENT_OTHER): Payer: Self-pay | Admitting: Obstetrics & Gynecology

## 2023-10-11 DIAGNOSIS — N812 Incomplete uterovaginal prolapse: Secondary | ICD-10-CM | POA: Insufficient documentation

## 2023-10-11 NOTE — Progress Notes (Signed)
 GYNECOLOGY  VISIT  CC: vaginal pressure/bulge  HPI: 58 y.o. G24P4004 Married White or Caucasian female here for complaint of vaginal pressure and bulge that seems to be worsening.  Has been present for several months.  Occurred after having URI.  Just seemed to be present all of a sudden.  Denies urinary incontinence.  No vaginal bleeding. Does have IUD in place.  Denies bowel issues.  Absolutely does not want any surgery.   Past Medical History:  Diagnosis Date   Allergy    Asthma    History of DVT (deep vein thrombosis)        History of iron deficiency anemia    resolved after Mirena IUD placement   Menorrhagia with regular cycle    resolved after Mirena IUD placement   Vitamin D deficiency     MEDS:   Current Outpatient Medications on File Prior to Visit  Medication Sig Dispense Refill   aspirin 325 MG tablet Take 325 mg by mouth daily.     montelukast (SINGULAIR) 10 MG tablet Take 10 mg by mouth at bedtime.     naproxen (EC NAPROSYN) 500 MG EC tablet Take 500 mg by mouth 2 (two) times daily with a meal.     oxybutynin (DITROPAN-XL) 10 MG 24 hr tablet TAKE 1 TABLET BY MOUTH EVERYDAY AT BEDTIME 90 tablet 0   No current facility-administered medications on file prior to visit.    ALLERGIES: Clarithromycin, Doxycycline, Prednisone, Sulfa antibiotics, and Amoxicillin  SH:  married, non smoker  Review of Systems  Constitutional: Negative.   Genitourinary:        Vaginal pressure    PHYSICAL EXAMINATION:    BP 130/75 (BP Location: Left Arm, Patient Position: Sitting, Cuff Size: Large)   Pulse 82   Ht 5\' 7"  (1.702 m) Comment: Reported  Wt 145 lb 6.4 oz (66 kg)   BMI 22.77 kg/m     General appearance: alert, cooperative and appears stated age  Lymph:  no inguinal LAD noted Pelvic: External genitalia:  no lesions              Urethra:  normal appearing urethra with no masses, tenderness or lesions              Bartholins and Skenes: normal                 Vagina:   normal mucosa with grade 3 cystocele (with valsalva) present with incomplete uterine prolapse present as well              Cervix: no lesions              Bimanual Exam:  Uterus:  normal size, contour, position, consistency, mobility, non-tender              Adnexa: no mass, fullness, tenderness            Discussed findings today with pt as well as options for treatment.  She decided to proceed with pessary fitting.  #4 incontinence ring with support, no knob placed.  This was too small so removed.  #5 incontinence ring with support, no knob, placed with good fit.  Pt could not expel pessary with valsalva.  Did not have one available for her so will need to order.  She was able to easily remove the pessary herself.  Chaperone, Ina Homes, CMA, was present for exam.  Assessment/Plan: 1. Cystocele with incomplete uterovaginal prolapse (Primary) - pessary fitting occurred today - pt will  return for placement once pessary is ordered and arrives  2.  IUD in place - FDA approval length reviewed today.  Will not remove at this time.  3. History of DVT (deep vein thrombosis)

## 2023-10-20 ENCOUNTER — Encounter (HOSPITAL_BASED_OUTPATIENT_CLINIC_OR_DEPARTMENT_OTHER): Payer: Self-pay | Admitting: Obstetrics & Gynecology

## 2023-10-20 ENCOUNTER — Ambulatory Visit (HOSPITAL_BASED_OUTPATIENT_CLINIC_OR_DEPARTMENT_OTHER): Payer: Self-pay | Admitting: Obstetrics & Gynecology

## 2023-10-20 ENCOUNTER — Ambulatory Visit (INDEPENDENT_AMBULATORY_CARE_PROVIDER_SITE_OTHER): Payer: Self-pay | Admitting: Obstetrics & Gynecology

## 2023-10-20 ENCOUNTER — Encounter (HOSPITAL_BASED_OUTPATIENT_CLINIC_OR_DEPARTMENT_OTHER): Payer: Self-pay

## 2023-10-20 VITALS — BP 131/53 | HR 67 | Ht 67.0 in | Wt 145.4 lb

## 2023-10-20 DIAGNOSIS — Z975 Presence of (intrauterine) contraceptive device: Secondary | ICD-10-CM

## 2023-10-20 DIAGNOSIS — Z86718 Personal history of other venous thrombosis and embolism: Secondary | ICD-10-CM

## 2023-10-20 DIAGNOSIS — N812 Incomplete uterovaginal prolapse: Secondary | ICD-10-CM

## 2023-10-22 NOTE — Progress Notes (Signed)
 58 y.o. Married White female [phone] here for pessary placement.  #4 incontinence ring with support ordered as we did not have this in the office.  She is here for placement today.  Insertion and removal taught and she was capable of removing and replacing this.  She is SA so knows she will need to removed for this.  Also, recommend removing at least once every two weeks for cleaning and leaving out overnight.  After placement, pt walked without any issues.  Able to void but with valsalve on toilet, could expel the pessary.  Given her symtoms, feel best of her to keep this one and will order larger size for her.  Pt comfortable with plan.    Allergies  Allergen Reactions   Clarithromycin Swelling    Tongue swelling Tongue swelling Tongue swelling   Doxycycline    Prednisone Other (See Comments)   Sulfa Antibiotics    Amoxicillin Rash    ROS: Denies:  dysuria, pelvic pain  Exam:   BP (!) 131/53 (BP Location: Right Arm, Patient Position: Sitting, Cuff Size: Large)   Pulse 67   Ht 5\' 7"  (1.702 m)   Wt 145 lb 6.4 oz (66 kg)   BMI 22.77 kg/m   General appearance: alert and no distress Inguinal lymph nodes:  not enlarged  Pelvic: External genitalia:  no lesions              Urethra: normal appearing urethra with no masses, tenderness or lesions              Bartholins and Skenes: Bartholin's, Urethra, Skene's normal                 Vagina: normal appearing vagina with normal color and discharge, no lesions, cystocele present               Pessary was placed without difficulty.  Assessment/Plan: 1. Cystocele with incomplete uterovaginal prolapse (Primary) - will order larger size and different brand to see which will fit pt better  2. History of DVT (deep vein thrombosis) - takes ASA 325mg   3. IUD (intrauterine device) in place - removal in 2027 indicated

## 2023-10-29 ENCOUNTER — Ambulatory Visit (HOSPITAL_BASED_OUTPATIENT_CLINIC_OR_DEPARTMENT_OTHER): Admitting: Obstetrics & Gynecology

## 2023-11-30 ENCOUNTER — Other Ambulatory Visit (HOSPITAL_BASED_OUTPATIENT_CLINIC_OR_DEPARTMENT_OTHER): Payer: Self-pay | Admitting: Certified Nurse Midwife

## 2024-02-16 ENCOUNTER — Telehealth (HOSPITAL_BASED_OUTPATIENT_CLINIC_OR_DEPARTMENT_OTHER): Payer: Self-pay | Admitting: Obstetrics & Gynecology

## 2024-02-16 NOTE — Telephone Encounter (Signed)
 New message   The patient reports ongoing bladder concerns, describing a persistent heavy sensation and the feeling of incomplete emptying. Additionally, the patient mentions needing to change clothes due to these issues.  An appointment was offered for this coming Thursday, 02/18/24 the patient declined due to insurance-related concerns.

## 2024-02-17 NOTE — Telephone Encounter (Signed)
 F/u   The patient called back regarding previous message explain to the patient after reading MD notes she will need to come into the office for an appt due to complexity.   CNM Arland Roller next available 03/03/24 patient decline the appt.   The patient verbalized transportation issues, voiced she will call back.

## 2024-03-03 ENCOUNTER — Other Ambulatory Visit (HOSPITAL_BASED_OUTPATIENT_CLINIC_OR_DEPARTMENT_OTHER): Payer: Self-pay | Admitting: Certified Nurse Midwife

## 2024-03-09 ENCOUNTER — Other Ambulatory Visit: Payer: Self-pay

## 2024-03-09 ENCOUNTER — Encounter: Payer: Self-pay | Admitting: Family

## 2024-03-09 ENCOUNTER — Emergency Department (HOSPITAL_BASED_OUTPATIENT_CLINIC_OR_DEPARTMENT_OTHER)
Admission: EM | Admit: 2024-03-09 | Discharge: 2024-03-09 | Disposition: A | Discharge status: Home / Self Care | Payer: Self-pay | Attending: Emergency Medicine | Admitting: Emergency Medicine

## 2024-03-09 ENCOUNTER — Emergency Department (HOSPITAL_BASED_OUTPATIENT_CLINIC_OR_DEPARTMENT_OTHER): Payer: Self-pay | Admitting: Radiology

## 2024-03-09 ENCOUNTER — Encounter (HOSPITAL_BASED_OUTPATIENT_CLINIC_OR_DEPARTMENT_OTHER): Payer: Self-pay | Admitting: Emergency Medicine

## 2024-03-09 DIAGNOSIS — R55 Syncope and collapse: Secondary | ICD-10-CM | POA: Insufficient documentation

## 2024-03-09 DIAGNOSIS — R002 Palpitations: Secondary | ICD-10-CM | POA: Insufficient documentation

## 2024-03-09 DIAGNOSIS — Z8616 Personal history of COVID-19: Secondary | ICD-10-CM | POA: Insufficient documentation

## 2024-03-09 LAB — BASIC METABOLIC PANEL WITH GFR
Anion gap: 15 (ref 5–15)
BUN: 12 mg/dL (ref 6–20)
CO2: 21 mmol/L — ABNORMAL LOW (ref 22–32)
Calcium: 9.7 mg/dL (ref 8.9–10.3)
Chloride: 101 mmol/L (ref 98–111)
Creatinine, Ser: 0.86 mg/dL (ref 0.44–1.00)
GFR, Estimated: >60 mL/min (ref >60)
Glucose, Bld: 127 mg/dL — ABNORMAL HIGH (ref 70–99)
Potassium: 3.5 mmol/L (ref 3.5–5.1)
Sodium: 137 mmol/L (ref 135–145)

## 2024-03-09 LAB — CBC WITH DIFFERENTIAL/PLATELET
Abs Immature Granulocytes: 0.03 K/uL (ref 0.00–0.07)
Basophils Absolute: 0.1 K/uL (ref 0.0–0.1)
Basophils Relative: 1 %
Eosinophils Absolute: 0.1 K/uL (ref 0.0–0.5)
Eosinophils Relative: 1 %
HCT: 38.5 % (ref 36.0–46.0)
Hemoglobin: 13.3 g/dL (ref 12.0–15.0)
Immature Granulocytes: 0 %
Lymphocytes Relative: 12 %
Lymphs Abs: 1 K/uL (ref 0.7–4.0)
MCH: 31.4 pg (ref 26.0–34.0)
MCHC: 34.5 g/dL (ref 30.0–36.0)
MCV: 90.8 fL (ref 80.0–100.0)
Monocytes Absolute: 0.5 K/uL (ref 0.1–1.0)
Monocytes Relative: 6 %
Neutro Abs: 6.7 K/uL (ref 1.7–7.7)
Neutrophils Relative %: 80 %
Platelets: 251 K/uL (ref 150–400)
RBC: 4.24 MIL/uL (ref 3.87–5.11)
RDW: 12.6 % (ref 11.5–15.5)
WBC: 8.3 K/uL (ref 4.0–10.5)
nRBC: 0 % (ref 0.0–0.2)

## 2024-03-09 LAB — RESP PANEL BY RT-PCR (RSV, FLU A&B, COVID)  RVPGX2
Influenza A by PCR: NEGATIVE
Influenza B by PCR: NEGATIVE
Resp Syncytial Virus by PCR: NEGATIVE
SARS Coronavirus 2 by RT PCR: NEGATIVE

## 2024-03-09 LAB — MAGNESIUM: Magnesium: 1.9 mg/dL (ref 1.7–2.4)

## 2024-03-09 LAB — TROPONIN T, HIGH SENSITIVITY: Troponin T High Sensitivity: <15 ng/L (ref <19)

## 2024-03-09 MED ORDER — ONDANSETRON HCL 4 MG PO TABS: 4.0000 mg | ORAL_TABLET | Freq: Three times a day (TID) | ORAL | 0 refills | Status: DC | PRN

## 2024-03-09 MED ORDER — ONDANSETRON 4 MG PO TBDP
4.0000 mg | ORAL_TABLET | Freq: Once | ORAL | Status: AC
  Administered 2024-03-09: 4 mg via ORAL
  Filled 2024-03-09: qty 1

## 2024-03-09 NOTE — ED Triage Notes (Signed)
 Around 7pm Felt heart racing and felt like I was bout to pass out

## 2024-03-09 NOTE — ED Notes (Signed)
 RN reviewed discharge instructions with pt. Pt verbalized understanding and had no further questions. VSS upon discharge.

## 2024-03-09 NOTE — ED Notes (Addendum)
 Patient transported to XR.

## 2024-03-09 NOTE — ED Provider Notes (Signed)
 [place] EMERGENCY DEPARTMENT AT [facility] Provider Note   CSN: [id number] Arrival date & time: 03/09/24  2140     Patient presents with: Palpitations   Krystal Mccoy is a 58 y.o. female here with palpitations.  The patient reports that she was doing a lot of physical work cleaning at her son's house today helping a move out.  She said that she woke up feeling well but then abruptly in the evening she began to have palpitations feel very lightheaded like she was going to pass out.  She continues to have this feeling although it is somewhat lessened.  She denies chest pain or pressure.  She reports she has been belching a lot.  She denies any known coronary cardiac history, or history of smoking, or history of diabetes or high blood pressure.  However she reports she has not seen a doctor in many years because I do not have insurance.  Symptom onset between 6-7 pm this evening.  She does report the symptoms do feel similar to when she had COVID 2 years ago, as well as when she had influenza in March of this year. She denies sick contacts in the house  The patient also reports to me that she has had many episodes of lightheadedness, most specifically when she is on the toilet with bowel movements.  She said this often makes her feel like she is in a pass out.  This included this evening.  She says that her stomach is sensitive, and when her stomach is upset she feels lightheaded.  She denies history of actual syncope.   HPI     Prior to Admission medications   Medication Sig Start Date End Date Taking? Authorizing Provider  ondansetron  (ZOFRAN ) 4 MG tablet Take 1 tablet (4 mg total) by mouth every 8 (eight) hours as needed for up to 12 doses for nausea or vomiting. 03/09/24  Yes Cottie Donnice PARAS, MD  aspirin 325 MG tablet Take 325 mg by mouth daily.    [provider]  montelukast (SINGULAIR) 10 MG tablet Take 10 mg by mouth at bedtime.    [provider]   naproxen  (EC NAPROSYN ) 500 MG EC tablet Take 500 mg by mouth 2 (two) times daily with a meal.    [provider]  oxybutynin  (DITROPAN -XL) 10 MG 24 hr tablet TAKE 1 TABLET BY MOUTH EVERYDAY AT BEDTIME 03/03/24   Lo, Donna K, CNM    Allergies: Clarithromycin, Doxycycline, Prednisone, Sulfa antibiotics, and Amoxicillin     Review of Systems  Updated Vital Signs BP (!) 160/90   Pulse 82   Temp 98.5 F (36.9 C) (Temporal)   Resp 20   SpO2 99%   Physical Exam Constitutional:      General: She is not in acute distress. HENT:     Head: Normocephalic and atraumatic.  Eyes:     Conjunctiva/sclera: Conjunctivae normal.     Pupils: Pupils are equal, round, and reactive to light.  Cardiovascular:     Rate and Rhythm: Normal rate and regular rhythm.  Pulmonary:     Effort: Pulmonary effort is normal. No respiratory distress.  Abdominal:     General: There is no distension.     Tenderness: There is no abdominal tenderness.  Skin:    General: Skin is warm and dry.  Neurological:     General: No focal deficit present.     Mental Status: She is alert. Mental status is at baseline.  Psychiatric:  Mood and Affect: Mood normal.        Behavior: Behavior normal.     (all labs ordered are listed, but only abnormal results are displayed) Labs Reviewed  BASIC METABOLIC PANEL WITH GFR - Abnormal; Notable for the following components:      Result Value   CO2 21 (*)    Glucose, Bld 127 (*)    All other components within normal limits  RESP PANEL BY RT-PCR (RSV, FLU A&B, COVID)  RVPGX2  CBC WITH DIFFERENTIAL/PLATELET  MAGNESIUM  TROPONIN T, HIGH SENSITIVITY    EKG: EKG Interpretation Date/Time:  Wednesday March 09 2024 21:52:03 EDT Ventricular Rate:  93 PR Interval:  159 QRS Duration:  93 QT Interval:  374 QTC Calculation: 466 R Axis:   103  Text Interpretation: Sinus rhythm Right axis deviation Confirmed by Cottie Cough [phone]) on 03/09/2024 9:59:47  PM  Radiology: [facility] Chest 2 View Result Date: 03/09/2024 CLINICAL DATA:  Shortness of breath and chest discomfort EXAM: CHEST - 2 VIEW COMPARISON:  06/29/2022 FINDINGS: The heart size and mediastinal contours are within normal limits. Both lungs are clear. The visualized skeletal structures are unremarkable. IMPRESSION: No active cardiopulmonary disease. Electronically Signed   By: Luke Bun M.D.   On: 03/09/2024 22:21     Procedures   Medications Ordered in the ED  ondansetron  (ZOFRAN -ODT) disintegrating tablet 4 mg (4 mg Oral Given 03/09/24 2310)                                    Medical Decision Making Amount and/or Complexity of Data Reviewed Labs: ordered. Radiology: ordered.  Risk Prescription drug management.   This patient presents to the ED with concern for palpitations, near syncope. This involves an extensive number of treatment options, and is a complaint that carries with it a high risk of complications and morbidity.  The differential diagnosis includes arrhythmia versus atypical ACS versus anemia versus metabolic derangement versus other   External records from outside source obtained and reviewed including ER visit in November 2023 for lightheadedness and dizziness and near syncope. Patient's COVID test was positive at that time.  I ordered and personally interpreted labs.  The pertinent results include: No emergent findings.  Specifically troponin is undetectable with greater than 3 hours from symptom onset.  COVID and flu test are negative.  I ordered imaging studies including x-ray of the chest I independently visualized and interpreted imaging which showed no emergent finding I agree with the radiologist interpretation  The patient was maintained on a cardiac monitor.  I personally viewed and interpreted the cardiac monitored which showed an underlying rhythm of: Sinus rhythm  Per my interpretation the patient's ECG shows sinus rhythm with no acute ischemic  findings  I ordered medication including Zofran  for nausea  I have reviewed the patients home medicines and have made adjustments as needed  Test Considered: I have lower suspicion for acute PE in his clinical setting.  The patient has not had persistent tachycardia or hypoxia.  After the interventions noted above, I reevaluated the patient and found that they have: improved  I suspect may be some gastrocolic reflex and possible vasovagal episodes given her history of having near syncope or lightheadedness on the toilet with bowel movements, which is frequent, and her GI upset at this time, which appears to be associated with her lightheadedness.  However given her age and lack of PCP, potentially  undiagnosed medical conditions and cardiovascular risk factors, and her exertional symptoms, I do think a referral to cardiology would be reasonable.  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  She understands that a single negative workup in the ER is not comprehensive, there may be more serious medical condition that needs worked up.  We will try to get her in as an outpatient with cardiology.  I also encouraged her to drink plenty of fluids at home.  She is stable for discharge  Disposition:  After consideration of the diagnostic results and the patient's response to treatment, I feel that the patent would benefit from outpatient follow up.      Final diagnoses:  Palpitations  Near syncope    ED Discharge Orders          Ordered    ondansetron  (ZOFRAN ) 4 MG tablet  Every 8 hours PRN        03/09/24 2314    Ambulatory referral to Cardiology        03/09/24 2314               Cottie Donnice PARAS, MD 03/09/24 2317

## 2024-03-09 NOTE — Discharge Instructions (Addendum)
 Please drink lots of water to stay hydrated at home.  Take your time getting up from the toilet in the bathroom.  You can use Zofran  as needed for nausea.  I do recommend you follow-up with a cardiologist for these episodes of dizziness.

## 2024-03-10 ENCOUNTER — Encounter: Payer: Self-pay | Admitting: Family

## 2024-05-06 ENCOUNTER — Emergency Department (HOSPITAL_BASED_OUTPATIENT_CLINIC_OR_DEPARTMENT_OTHER): Payer: Self-pay

## 2024-05-06 ENCOUNTER — Emergency Department (HOSPITAL_BASED_OUTPATIENT_CLINIC_OR_DEPARTMENT_OTHER)
Admission: EM | Admit: 2024-05-06 | Discharge: 2024-05-06 | Disposition: A | Discharge status: Home / Self Care | Payer: Self-pay | Attending: Emergency Medicine | Admitting: Emergency Medicine

## 2024-05-06 ENCOUNTER — Other Ambulatory Visit: Payer: Self-pay

## 2024-05-06 ENCOUNTER — Encounter (HOSPITAL_BASED_OUTPATIENT_CLINIC_OR_DEPARTMENT_OTHER): Payer: Self-pay | Admitting: Emergency Medicine

## 2024-05-06 DIAGNOSIS — N3000 Acute cystitis without hematuria: Secondary | ICD-10-CM | POA: Insufficient documentation

## 2024-05-06 DIAGNOSIS — Z7982 Long term (current) use of aspirin: Secondary | ICD-10-CM | POA: Insufficient documentation

## 2024-05-06 DIAGNOSIS — R29818 Other symptoms and signs involving the nervous system: Secondary | ICD-10-CM

## 2024-05-06 LAB — URINALYSIS, W/ REFLEX TO CULTURE (INFECTION SUSPECTED)
Bilirubin Urine: NEGATIVE
Glucose, UA: NEGATIVE mg/dL
Ketones, ur: NEGATIVE mg/dL
Nitrite: POSITIVE — AB
Protein, ur: 30 mg/dL — AB
Specific Gravity, Urine: 1.023 (ref 1.005–1.030)
WBC, UA: >50 WBC/hpf (ref 0–5)
pH: 6 (ref 5.0–8.0)

## 2024-05-06 LAB — COMPREHENSIVE METABOLIC PANEL WITH GFR
ALT: 19 U/L (ref 0–44)
AST: 19 U/L (ref 15–41)
Albumin: 4.4 g/dL (ref 3.5–5.0)
Alkaline Phosphatase: 120 U/L (ref 38–126)
Anion gap: 13 (ref 5–15)
BUN: 11 mg/dL (ref 6–20)
CO2: 25 mmol/L (ref 22–32)
Calcium: 9.8 mg/dL (ref 8.9–10.3)
Chloride: 101 mmol/L (ref 98–111)
Creatinine, Ser: 0.88 mg/dL (ref 0.44–1.00)
GFR, Estimated: >60 mL/min (ref >60)
Glucose, Bld: 131 mg/dL — ABNORMAL HIGH (ref 70–99)
Potassium: 3.5 mmol/L (ref 3.5–5.1)
Sodium: 139 mmol/L (ref 135–145)
Total Bilirubin: 0.8 mg/dL (ref 0.0–1.2)
Total Protein: 7.6 g/dL (ref 6.5–8.1)

## 2024-05-06 LAB — PROTIME-INR
INR: 1.2 (ref 0.8–1.2)
Prothrombin Time: 16.1 s — ABNORMAL HIGH (ref 11.4–15.2)

## 2024-05-06 LAB — CBC WITH DIFFERENTIAL/PLATELET
Abs Immature Granulocytes: 0.07 K/uL (ref 0.00–0.07)
Basophils Absolute: 0.1 K/uL (ref 0.0–0.1)
Basophils Relative: 0 %
Eosinophils Absolute: 0 K/uL (ref 0.0–0.5)
Eosinophils Relative: 0 %
HCT: 41.6 % (ref 36.0–46.0)
Hemoglobin: 14.2 g/dL (ref 12.0–15.0)
Immature Granulocytes: 1 %
Lymphocytes Relative: 7 %
Lymphs Abs: 1 K/uL (ref 0.7–4.0)
MCH: 31.1 pg (ref 26.0–34.0)
MCHC: 34.1 g/dL (ref 30.0–36.0)
MCV: 91.2 fL (ref 80.0–100.0)
Monocytes Absolute: 1.2 K/uL — ABNORMAL HIGH (ref 0.1–1.0)
Monocytes Relative: 9 %
Neutro Abs: 11.3 K/uL — ABNORMAL HIGH (ref 1.7–7.7)
Neutrophils Relative %: 83 %
Platelets: 302 K/uL (ref 150–400)
RBC: 4.56 MIL/uL (ref 3.87–5.11)
RDW: 12.2 % (ref 11.5–15.5)
WBC: 13.6 K/uL — ABNORMAL HIGH (ref 4.0–10.5)
nRBC: 0 % (ref 0.0–0.2)

## 2024-05-06 LAB — RESP PANEL BY RT-PCR (RSV, FLU A&B, COVID)  RVPGX2
Influenza A by PCR: NEGATIVE
Influenza B by PCR: NEGATIVE
Resp Syncytial Virus by PCR: NEGATIVE
SARS Coronavirus 2 by RT PCR: NEGATIVE

## 2024-05-06 LAB — APTT: aPTT: 29 s (ref 24–36)

## 2024-05-06 MED ORDER — SODIUM CHLORIDE 0.9 % IV BOLUS
1000.0000 mL | Freq: Once | INTRAVENOUS | Status: AC
  Administered 2024-05-06: 1000 mL via INTRAVENOUS

## 2024-05-06 MED ORDER — MIDAZOLAM HCL 2 MG/2ML IJ SOLN
2.0000 mg | Freq: Once | INTRAMUSCULAR | Status: AC
Start: 2024-05-06 — End: 2024-05-06
  Administered 2024-05-06: 2 mg via INTRAVENOUS
  Filled 2024-05-06: qty 2

## 2024-05-06 MED ORDER — CIPROFLOXACIN IN D5W 400 MG/200ML IV SOLN
400.0000 mg | Freq: Once | INTRAVENOUS | Status: AC
  Administered 2024-05-06: 400 mg via INTRAVENOUS
  Filled 2024-05-06: qty 200

## 2024-05-06 MED ORDER — CIPROFLOXACIN HCL 500 MG PO TABS: 500.0000 mg | ORAL_TABLET | Freq: Two times a day (BID) | ORAL | 0 refills | Status: AC

## 2024-05-06 NOTE — Progress Notes (Addendum)
 Assessment and Plan   1. Light headed (Primary) 2. Aphasia 3. Sepsis, due to unspecified organism, unspecified whether acute organ dysfunction present (*) 4. Acute cystitis with hematuria    Due to her significant lightheadedness and dizziness as well as slurred speech.  I am concerned she may have had a stroke.  I told her I would like to call 911 to transport her to the emergency room.  She refused emergency transport.  I spoke with both her and her husband her husband will take her directly to the emergency room to be evaluated for a possible stroke  Outside records, labs, and imaging reviewed Imaging showed no stroke today   Labs indicate urosepsis with elevated white count and nitrite positive UA      Risks, benefits, and alternatives of the medications and treatment plan prescribed today were discussed, and patient expressed understanding. Plan follow-up as discussed or as needed if any worsening symptoms or change in condition.      Subjective   Krystal Mccoy is a 58 y.o. (DOB 05-08-1966) female who presents for the following:    Patient presents with  . Dizziness    Lightheaded dizzy and chills since this am     Dizziness Associated symptoms include dizziness and light-headedness. Pertinent negatives include no chest pain or palpitations.   patient presents today with extreme lightheadedness and dizziness.  It started this morning.  She states she can barely stand up and walk due to her lightheadedness and dizziness.  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   Reviewed and updated this visit by provider: Tobacco  Allergies  Meds  Problems  Med Hx  Surg Hx  Fam Hx      Review of Systems  Respiratory:  Negative for shortness of breath and wheezing.   Cardiovascular:  Negative for chest pain, palpitations and leg swelling.  Genitourinary:  Negative for difficulty urinating and urgency.  Musculoskeletal:  Negative for gait  problem.  Neurological:  Positive for dizziness, speech difficulty and light-headedness.  Hematological:  Does not bruise/bleed easily.          Objective   Vitals:   05/06/24 1335  BP: 124/74  Patient Position: Sitting  Pulse: 92  Temp: 98.2 F (36.8 C)  TempSrc: Oral  Resp: 16  Height: 5' 5.75 (1.67 m)  Weight: 141 lb 3.2 oz (64 kg)  SpO2: 99%  BMI (Calculated): 23  PainSc: 0-No pain    Physical Exam Vitals and nursing note reviewed.  HENT:     Head: Normocephalic and atraumatic.     Nose: No congestion or rhinorrhea.     Mouth/Throat:     Mouth: Mucous membranes are moist.     Pharynx: Oropharynx is clear.     Comments: Tongue midline Cardiovascular:     Rate and Rhythm: Normal rate and regular rhythm.     Pulses: Normal pulses.     Heart sounds: Normal heart sounds.  Pulmonary:     Effort: No respiratory distress.     Breath sounds: Normal breath sounds. No wheezing.  Skin:    General: Skin is warm and dry.  Neurological:     Mental Status: She is alert.     Sensory: No sensory deficit.     Comments: Slurred speech   difficulty getting words out   aphagia/dysphagia  Psychiatric:        Mood and Affect: Mood normal.  Behavior: Behavior normal.

## 2024-05-06 NOTE — ED Triage Notes (Addendum)
 Patient reports lightheadedness and difficulty finding words and slurred speech since waking up this morning around 0900am. No issue speaking during triage.   LKN 2100

## 2024-05-06 NOTE — ED Provider Notes (Signed)
 [place] EMERGENCY DEPARTMENT AT [facility] Provider Note   CSN: [id number] Arrival date & time: 05/06/24  1440     Patient presents with: Aphasia   Krystal Mccoy is a 58 y.o. female.  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  Patient reports lightheadedness, dizziness and cold chills beginning this morning around 1100. LKW 2100 last night.  She was seen in her PCP office and directed here for further evaluation due to significant dizziness as well as slurred speech.  Aside from the symptoms she has no pertinent positives.  Denies headaches, nausea, vomiting, falls and trauma, fevers, focal weakness and paresthesias.   HPI     Prior to Admission medications   Medication Sig Start Date End Date Taking? Authorizing Provider  ciprofloxacin  (CIPRO ) 500 MG tablet Take 1 tablet (500 mg total) by mouth every 12 (twelve) hours. 05/06/24  Yes Pamella Sharper A, DO  aspirin 325 MG tablet Take 325 mg by mouth daily.    [provider]  montelukast (SINGULAIR) 10 MG tablet Take 10 mg by mouth at bedtime.    [provider]  naproxen  (EC NAPROSYN ) 500 MG EC tablet Take 500 mg by mouth 2 (two) times daily with a meal.    [provider]  ondansetron  (ZOFRAN ) 4 MG tablet Take 1 tablet (4 mg total) by mouth every 8 (eight) hours as needed for up to 12 doses for nausea or vomiting. 03/09/24   Cottie Donnice PARAS, MD  oxybutynin  (DITROPAN -XL) 10 MG 24 hr tablet TAKE 1 TABLET BY MOUTH EVERYDAY AT BEDTIME 03/03/24   Lo, Donna K, CNM    Allergies: Clarithromycin, Doxycycline, Prednisone, Sulfa antibiotics, and Amoxicillin     Review of Systems  Updated Vital Signs BP 123/63   Pulse 84   Temp 99.3 F (37.4 C) (Oral)   Resp 18   SpO2 100%   Physical Exam Vitals and nursing note reviewed.  HENT:     Head: Normocephalic and atraumatic.  Eyes:     Pupils: Pupils  are equal, round, and reactive to light.  Cardiovascular:     Rate and Rhythm: Normal rate and regular rhythm.  Pulmonary:     Effort: Pulmonary effort is normal.     Breath sounds: Normal breath sounds.  Abdominal:     Palpations: Abdomen is soft.     Tenderness: There is no abdominal tenderness.  Skin:    General: Skin is warm and dry.  Neurological:     Mental Status: She is alert and oriented to person, place, and time.     Sensory: No sensory deficit.     Motor: No weakness.     Comments: Difficulty with speech production (says  no buts ifs or ands repeatedly) No facial asymmetry No gaze deviation Monocular blindness of right eye, chronic Vision grossly intact from left eye Able to read words aloud  Psychiatric:        Mood and Affect: Mood normal.     (all labs ordered are listed, but only abnormal results are displayed) Labs Reviewed  COMPREHENSIVE METABOLIC PANEL WITH GFR - Abnormal; Notable for the following components:      Result Value   Glucose, Bld 131 (*)    All other components within normal limits  CBC WITH DIFFERENTIAL/PLATELET - Abnormal; Notable for the following components:   WBC 13.6 (*)    Neutro Abs 11.3 (*)  Monocytes Absolute 1.2 (*)    All other components within normal limits  URINALYSIS, W/ REFLEX TO CULTURE (INFECTION SUSPECTED) - Abnormal; Notable for the following components:   APPearance HAZY (*)    Hgb urine dipstick TRACE (*)    Protein, ur 30 (*)    Nitrite POSITIVE (*)    Leukocytes,Ua MODERATE (*)    Bacteria, UA FEW (*)    All other components within normal limits  PROTIME-INR - Abnormal; Notable for the following components:   Prothrombin Time 16.1 (*)    All other components within normal limits  RESP PANEL BY RT-PCR (RSV, FLU A&B, COVID)  RVPGX2  URINE CULTURE  APTT    EKG: EKG Interpretation Date/Time:  Friday May 06 2024 14:51:57 EDT Ventricular Rate:  109 PR Interval:  145 QRS Duration:  86 QT  Interval:  316 QTC Calculation: 426 R Axis:   103  Text Interpretation: Sinus tachycardia Right axis deviation Confirmed by Pamella Sharper [phone]) on 05/06/2024 8:15:07 PM  Radiology: MR BRAIN WO CONTRAST Result Date: 05/06/2024 EXAM: MRI BRAIN WITHOUT CONTRAST 05/06/2024 04:53:45 PM TECHNIQUE: Multiplanar multisequence MRI of the head/brain was performed without the administration of intravenous contrast. COMPARISON: CT head without contrast 05/06/2024. CLINICAL HISTORY: Neuro deficit, acute, stroke suspected; new onset dysarthria, dizziness, ?CVA. Triage note: Patient reports lightheadedness and difficulty finding words and slurred speech since waking up this morning around 0900am. No issue speaking during triage. LKN 2100. FINDINGS: BRAIN AND VENTRICLES: No acute infarct. No intracranial hemorrhage. No mass. No midline shift. No hydrocephalus. The sella is unremarkable. Normal flow voids. ORBITS: No acute abnormality. SINUSES AND MASTOIDS: No acute abnormality. BONES AND SOFT TISSUES: Normal marrow signal. No acute soft tissue abnormality. IMPRESSION: 1. No acute intracranial abnormality. Electronically signed by: Lonni Necessary MD 05/06/2024 05:52 PM EDT RP Workstation: [id number]   CT Head Wo Contrast Result Date: 05/06/2024 CLINICAL DATA:  Neuro deficit, acute stroke suspected EXAM: CT HEAD WITHOUT CONTRAST TECHNIQUE: Contiguous axial images were obtained from the base of the skull through the vertex without intravenous contrast. RADIATION DOSE REDUCTION: This exam was performed according to the departmental dose-optimization program which includes automated exposure control, adjustment of the mA and/or kV according to patient size and/or use of iterative reconstruction technique. COMPARISON:  None Available. FINDINGS: CT HEAD: Attenuation in the brain parenchyma is normal. There is no dense vessel. There is no hemorrhage. No acute ischemic changes. No mass lesion. The ventricles are normal.  Skull/sinuses/orbits: No significant abnormality. IMPRESSION: Normal Electronically Signed   By: Nancyann Burns M.D.   On: 05/06/2024 16:16     Procedures   Medications Ordered in the ED  ciprofloxacin  (CIPRO ) IVPB 400 mg (0 mg Intravenous Stopped 05/06/24 1708)  sodium chloride  0.9 % bolus 1,000 mL (0 mLs Intravenous Stopped 05/06/24 1823)  midazolam (VERSED) injection 2 mg (2 mg Intravenous Given 05/06/24 1621)    Clinical Course as of 05/06/24 2015  Fri May 06, 2024  1538 Initial laboratory workup most consistent with UTI.  Positive nitrites and WBCs.  Leukocytosis of 13.6.  Neutrophilic predominance.  Remainder of laboratory workup otherwise unremarkable.  + CT head without evidence of acute bleeding.  Most likely etiology for neurologic symptoms at this time would be UTI however need to rule out stroke.  Will obtain MRI here. [MP]  2003 CT MRI look okay.  Patient feeling better after fluids antibiotics here.  She remains at her normal baseline.  Husband states that her speech has been normal here.  Will discharge with outpatient prescription for UTI.  Multiple antibiotic allergies listed.  She tolerated Cipro  well here.  Will discharge with short course of Cipro .  Counseled her on need for probiotic/Greek yogurt to help minimize GI adverse effects [MP]    Clinical Course User Index [MP] Pamella Ozell LABOR, DO                                 Medical Decision Making 58 year old female with history as above presents to the ED for concern of new onset dizziness and changes in speech.  Sent in from PCP office.  Last known well time 2100 last night.  Awoke this morning with dizziness.  Difficulty with speech production on my exam.  No other deficits.  While outside of the window for thrombolytics but could remain a candidate for IR thrombectomy if indicated.  Will complete stroke workup here start with a CT head without contrast.  We do have MRI available which we can obtain here.  Other  differentials to consider would be electrode imbalance, dysrhythmia, viral illness or other infection such as UTI and pneumonia.  Will evaluate for these as well and continue to monitor any changes in neurologic status.  Amount and/or Complexity of Data Reviewed Labs: ordered. Radiology: ordered.  Risk Prescription drug management.        Final diagnoses:  Acute cystitis without hematuria    ED Discharge Orders          Ordered    ciprofloxacin  (CIPRO ) 500 MG tablet  Every 12 hours        05/06/24 2011               Pamella Ozell LABOR, DO 05/06/24 2015

## 2024-05-06 NOTE — Discharge Instructions (Signed)
 You were seen in the emerged department for lightheadedness and difficulty with speech Your CAT scan and MRI looked okay There is no evidence of stroke You have a urinary tract infection We gave your first dose of antibiotics here Pick up the rest of your antibiotics from your pharmacy and begin taking as directed to treat the UTI Keep well-hydrated at home Take [place] yogurt or probiotic with the antibiotics to help minimize GI side effects Return to the emergency room for severe headaches, worsening speech or any other concerns

## 2024-05-08 LAB — URINE CULTURE: Culture: >=100000 — AB

## 2024-05-09 ENCOUNTER — Telehealth (HOSPITAL_BASED_OUTPATIENT_CLINIC_OR_DEPARTMENT_OTHER): Payer: Self-pay

## 2024-05-09 NOTE — Telephone Encounter (Signed)
 Post ED Visit - Positive Culture Follow-up  Culture report reviewed by antimicrobial stewardship pharmacist: [facility] [x]  Carmelita Rocher, Pharm.D. []  Venetia Gully, Pharm.D., BCPS AQ-ID []  Garrel Crews, Pharm.D., BCPS []  Almarie Lunger, [address].D., BCPS []  [place], [address].D., BCPS, AAHIVP []  Rosaline Bihari, Pharm.D., BCPS, AAHIVP []  Vernell Meier, PharmD, BCPS []  Latanya Hint, PharmD, BCPS []  Donald Medley, PharmD, BCPS []  Rocky Bold, PharmD []  Dorothyann Alert, PharmD, BCPS []  Morene Babe, PharmD  [facility] []  Rosaline Edison, PharmD []  Romona Bliss, PharmD []  Dolphus Roller, PharmD []  Veva Seip, Rph []  Vernell Daunt) [name], PharmD []  Eva Allis, PharmD []  Rosaline Millet, PharmD []  Iantha Batch, PharmD []  Arvin Gauss, PharmD []  Wanda Hasting, PharmD []  Ronal Rav, PharmD []  Rocky Slade, PharmD []  Bard Jeans, PharmD   Positive urine culture Treated with Ciprofloxacin , organism sensitive to the same and no further patient follow-up is required at this time.  Krystal Mccoy 05/09/2024, 10:27 AM

## 2024-05-26 ENCOUNTER — Encounter: Payer: Self-pay | Admitting: Family

## 2024-05-26 ENCOUNTER — Emergency Department (HOSPITAL_BASED_OUTPATIENT_CLINIC_OR_DEPARTMENT_OTHER)
Admission: EM | Admit: 2024-05-26 | Discharge: 2024-05-26 | Disposition: A | Discharge status: Home / Self Care | Payer: Self-pay | Attending: Emergency Medicine | Admitting: Emergency Medicine

## 2024-05-26 ENCOUNTER — Other Ambulatory Visit: Payer: Self-pay

## 2024-05-26 ENCOUNTER — Encounter (HOSPITAL_BASED_OUTPATIENT_CLINIC_OR_DEPARTMENT_OTHER): Payer: Self-pay

## 2024-05-26 DIAGNOSIS — K611 Rectal abscess: Secondary | ICD-10-CM | POA: Insufficient documentation

## 2024-05-26 DIAGNOSIS — R42 Dizziness and giddiness: Secondary | ICD-10-CM | POA: Insufficient documentation

## 2024-05-26 DIAGNOSIS — Z7982 Long term (current) use of aspirin: Secondary | ICD-10-CM | POA: Insufficient documentation

## 2024-05-26 LAB — COMPREHENSIVE METABOLIC PANEL WITH GFR
ALT: 16 U/L (ref 0–44)
AST: 20 U/L (ref 15–41)
Albumin: 4.3 g/dL (ref 3.5–5.0)
Alkaline Phosphatase: 99 U/L (ref 38–126)
Anion gap: 13 (ref 5–15)
BUN: 9 mg/dL (ref 6–20)
CO2: 22 mmol/L (ref 22–32)
Calcium: 9.6 mg/dL (ref 8.9–10.3)
Chloride: 104 mmol/L (ref 98–111)
Creatinine, Ser: 0.97 mg/dL (ref 0.44–1.00)
GFR, Estimated: >60 mL/min (ref >60)
Glucose, Bld: 116 mg/dL — ABNORMAL HIGH (ref 70–99)
Potassium: 3.8 mmol/L (ref 3.5–5.1)
Sodium: 139 mmol/L (ref 135–145)
Total Bilirubin: 1.1 mg/dL (ref 0.0–1.2)
Total Protein: 6.7 g/dL (ref 6.5–8.1)

## 2024-05-26 LAB — URINALYSIS, ROUTINE W REFLEX MICROSCOPIC
Bacteria, UA: NONE SEEN
Bilirubin Urine: NEGATIVE
Glucose, UA: NEGATIVE mg/dL
Ketones, ur: NEGATIVE mg/dL
Leukocytes,Ua: NEGATIVE
Nitrite: NEGATIVE
Protein, ur: NEGATIVE mg/dL
Specific Gravity, Urine: 1.006 (ref 1.005–1.030)
pH: 6.5 (ref 5.0–8.0)

## 2024-05-26 LAB — TROPONIN T, HIGH SENSITIVITY: Troponin T High Sensitivity: <15 ng/L (ref 0–19)

## 2024-05-26 LAB — CBC WITH DIFFERENTIAL/PLATELET
Abs Immature Granulocytes: 0.28 K/uL — ABNORMAL HIGH (ref 0.00–0.07)
Basophils Absolute: 0 K/uL (ref 0.0–0.1)
Basophils Relative: 0 %
Eosinophils Absolute: 0.1 K/uL (ref 0.0–0.5)
Eosinophils Relative: 1 %
HCT: 40.1 % (ref 36.0–46.0)
Hemoglobin: 13.7 g/dL (ref 12.0–15.0)
Immature Granulocytes: 2 %
Lymphocytes Relative: 1 %
Lymphs Abs: 0.2 K/uL — ABNORMAL LOW (ref 0.7–4.0)
MCH: 31.1 pg (ref 26.0–34.0)
MCHC: 34.2 g/dL (ref 30.0–36.0)
MCV: 91.1 fL (ref 80.0–100.0)
Monocytes Absolute: 0.6 K/uL (ref 0.1–1.0)
Monocytes Relative: 4 %
Neutro Abs: 14.1 K/uL — ABNORMAL HIGH (ref 1.7–7.7)
Neutrophils Relative %: 92 %
Platelets: 247 K/uL (ref 150–400)
RBC: 4.4 MIL/uL (ref 3.87–5.11)
RDW: 13 % (ref 11.5–15.5)
WBC: 15.2 K/uL — ABNORMAL HIGH (ref 4.0–10.5)
nRBC: 0 % (ref 0.0–0.2)

## 2024-05-26 MED ORDER — ONDANSETRON 4 MG PO TBDP
4.0000 mg | ORAL_TABLET | Freq: Once | ORAL | Status: AC
  Administered 2024-05-26: 4 mg via ORAL
  Filled 2024-05-26: qty 1

## 2024-05-26 MED ORDER — ONDANSETRON 4 MG PO TBDP: 4.0000 mg | ORAL_TABLET | Freq: Three times a day (TID) | ORAL | 0 refills | Status: AC | PRN

## 2024-05-26 NOTE — Discharge Instructions (Addendum)
 Please read and follow all provided instructions.  Your diagnoses today include:  1. Episodic lightheadedness   2. Perirectal abscess     Tests performed today include: An EKG of your heart: Showed slightly fast heart rate but no signs of heart attack Cardiac enzymes - a blood test for heart muscle damage, was normal Blood counts and electrolytes: Your white blood cell count was high but no other concerning findings Vital signs. See below for your results today.   Medications prescribed:  Zofran  (ondansetron ) - for nausea and vomiting  Take any prescribed medications only as directed.  Follow-up instructions: Please follow-up with your primary care provider in 3 days for recheck of symptoms.  You may continue the Bactrim antibiotic if desired, you may also discontinue this, however if you start feeling worse, I would continue the antibiotic.  Try taking the Zofran  30 minutes prior to taking this antibiotic to see if it helps with any intolerances.  Return instructions:  SEEK IMMEDIATE MEDICAL ATTENTION IF: You have severe chest pain, especially if the pain is crushing or pressure-like and spreads to the arms, back, neck, or jaw, or if you have sweating, nausea or vomiting, or trouble with breathing. THIS IS AN EMERGENCY. Do not wait to see if the pain will go away. Get medical help at once. Call 911. DO NOT drive yourself to the hospital.  You have significant dizziness, if you pass out, or have trouble walking.  Return if you have weakness in your arms or legs, slurred speech, trouble walking or talking, confusion, or trouble with your balance.  You have any other emergent concerns regarding your health.  Your vital signs today were: BP 122/63   Pulse 94   Temp 98.3 F (36.8 C) (Oral)   Resp 20   SpO2 99%  If your blood pressure (BP) was elevated above 135/85 this visit, please have this repeated by your doctor within one month. --------------

## 2024-05-26 NOTE — ED Triage Notes (Signed)
 Patient reports seeing her doctor yesterday to have an abscess removed. She says that they placed her on an antibiotic and she is wondering if she is having a reaction to the medication as she now feels light headed and dizzy and has been up since 3am. She is on a sulfa which she has taken prior and had no reaction.

## 2024-05-26 NOTE — ED Notes (Signed)
Given ginger ail and crackers for PO challenge

## 2024-05-26 NOTE — ED Provider Notes (Signed)
 [facility] AT [facility] Provider Note   CSN: [id number] Arrival date & time: 05/26/24  9058     Patient presents with: Dizziness   Krystal Mccoy is a 58 y.o. female.   Patient with history of DVT presents to the emergency department today for evaluation of lightheadedness.  She has been lightheaded before, states that this happens when she has an infection.  Patient was seen at urgent care yesterday and had a perirectal abscess treated.  She had I&D, placed on trimethoprim/sulfamethoxazole.  She feels that the abscessed area is improving.  She reports increasing lightheadedness and chills in the early morning hours today.  She denies associated chest pain or shortness of breath.  No full syncope.  She feels very lightheaded sitting and with standing and with movement.  No headache or vision change.  No strokelike symptoms.  No lower extremity swelling.  No abdominal pain.  She is eating and drinking normally, states that she drinks 4 bottles of water per day.  Of note patient was seen for new onset dizziness and question of dysarthria 20 days ago.  At that time she had MRI of the brain which was negative.  She was diagnosed with UTI and treated with Cipro .       Prior to Admission medications   Medication Sig Start Date End Date Taking? Authorizing Provider  aspirin 325 MG tablet Take 325 mg by mouth daily.    [provider]  ciprofloxacin  (CIPRO ) 500 MG tablet Take 1 tablet (500 mg total) by mouth every 12 (twelve) hours. 05/06/24   Pamella Ozell DELENA, DO  montelukast (SINGULAIR) 10 MG tablet Take 10 mg by mouth at bedtime.    [provider]  naproxen  (EC NAPROSYN ) 500 MG EC tablet Take 500 mg by mouth 2 (two) times daily with a meal.    [provider]  ondansetron  (ZOFRAN ) 4 MG tablet Take 1 tablet (4 mg total) by mouth every 8 (eight) hours as needed for up to 12 doses for nausea or vomiting. 03/09/24   Cottie Donnice PARAS, MD   oxybutynin  (DITROPAN -XL) 10 MG 24 hr tablet TAKE 1 TABLET BY MOUTH EVERYDAY AT BEDTIME 03/03/24   Lo, Donna K, CNM    Allergies: Clarithromycin, Doxycycline, Prednisone, Sulfa antibiotics, and Amoxicillin     Review of Systems  Updated Vital Signs BP (!) 141/76 (BP Location: Left Arm)   Pulse (!) 114   Temp 98.3 F (36.8 C) (Oral)   Resp 14   SpO2 100%   Physical Exam Vitals and nursing note reviewed.  Constitutional:      Appearance: She is well-developed. She is not diaphoretic.  HENT:     Head: Normocephalic and atraumatic.     Mouth/Throat:     Mouth: Mucous membranes are not dry.  Eyes:     Conjunctiva/sclera: Conjunctivae normal.  Neck:     Vascular: Normal carotid pulses. No JVD.     Trachea: Trachea normal. No tracheal deviation.  Cardiovascular:     Rate and Rhythm: Regular rhythm. Tachycardia present.     Pulses: No decreased pulses.          Radial pulses are 2+ on the right side and 2+ on the left side.     Heart sounds: Normal heart sounds, S1 normal and S2 normal. No murmur heard.    Comments: Heart rate 100-110. Pulmonary:     Effort: Pulmonary effort is normal. No respiratory distress.     Breath sounds: No  wheezing.  Chest:     Chest wall: No tenderness.  Abdominal:     General: Bowel sounds are normal.     Palpations: Abdomen is soft.     Tenderness: There is no abdominal tenderness. There is no guarding or rebound.  Musculoskeletal:        General: Normal range of motion.     Cervical back: Normal range of motion and neck supple. No muscular tenderness.  Skin:    General: Skin is warm and dry.     Coloration: Skin is not pale.     Comments: Pararectal abscess, appears to be healing well.  No drainage or significant surrounding cellulitis.  Neurological:     General: No focal deficit present.     Mental Status: She is alert. Mental status is at baseline.     Cranial Nerves: No cranial nerve deficit.     Motor: No weakness.     Comments: Patient  speaking fluently in full sentences.     (all labs ordered are listed, but only abnormal results are displayed) Labs Reviewed  CBC WITH DIFFERENTIAL/PLATELET - Abnormal; Notable for the following components:      Result Value   WBC 15.2 (*)    Neutro Abs 14.1 (*)    Lymphs Abs 0.2 (*)    Abs Immature Granulocytes 0.28 (*)    All other components within normal limits  COMPREHENSIVE METABOLIC PANEL WITH GFR - Abnormal; Notable for the following components:   Glucose, Bld 116 (*)    All other components within normal limits  URINALYSIS, ROUTINE W REFLEX MICROSCOPIC - Abnormal; Notable for the following components:   Color, Urine COLORLESS (*)    Hgb urine dipstick TRACE (*)    All other components within normal limits  TROPONIN T, HIGH SENSITIVITY    EKG: EKG Interpretation Date/Time:  Thursday May 26 2024 10:36:14 EDT Ventricular Rate:  103 PR Interval:  154 QRS Duration:  88 QT Interval:  336 QTC Calculation: 440 R Axis:   75  Text Interpretation: Sinus tachycardia No significant change since last tracing Confirmed by Dean Clarity [phone]) on 05/26/2024 10:52:54 AM  Radiology: No results found.   Procedures   Medications Ordered in the ED - No data to display  ED Course  Patient seen and examined. History obtained directly from patient and family member at bedside.  Labs/EKG: Ordered CBC, CMP, UA, troponin.  EKG due to mild tachycardia.  Orthostatic vital signs ordered.  Imaging: None ordered  Medications/Fluids: None ordered  Most recent vital signs reviewed and are as follows: BP (!) 141/76 (BP Location: Left Arm)   Pulse (!) 114   Temp 98.3 F (36.8 C) (Oral)   Resp 14   SpO2 100%   Initial impression: Patient with nondescript lightheadedness, also some tachycardia.  I&D on perirectal abscess yesterday which appears to be appropriately healing without complications.  No chest pain.  History of DVT but no current leg swelling or chest pain/shortness  of breath.  11:52 AM Reassessment performed. Patient appears well.  Exam stable.  Drinking ginger ale without difficulties.  Labs personally reviewed and interpreted including: CBC with elevated white blood cell count at 15.2, normal hemoglobin; CMP glucose 116 otherwise unremarkable; UA without signs of infection; troponin normal.  Orthostatic VS for the past 24 hrs:  BP- Lying Pulse- Lying BP- Sitting Pulse- Sitting BP- Standing at 0 minutes Pulse- Standing at 0 minutes  05/26/24 1044 132/64 95 124/72 100 125/73 113    Reviewed  pertinent lab work and imaging with patient at bedside. Questions answered.   Most current vital signs reviewed and are as follows: BP 122/63   Pulse 94   Temp 98.3 F (36.8 C) (Oral)   Resp 20   SpO2 99%   Plan: Discharge to home.   We discussed overall reassuring workup.  White blood cell count, most likely elevated due to concurrent abscess which appears to be resolving.  Do not suspect sepsis at this time.  Heart rate improved.    We discussed possible options in regards to abscess treatment.  Encouraged warm soaks at home.  Discussed that we could potentially discontinue the antibiotic given clinical improvement and abscess without significant cellulitis after I&D, although she does have elevated white blood cell count today; we could continue the Bactrim and try to treat symptoms with Zofran  given that her symptoms today are an intolerance and not a true allergy; we could also switch antibiotics but the only option would really be clindamycin which patient would like to avoid due to possibility of diarrhea.  I will prescribe Bactrim for patient to use to treat symptoms.  Will leave decision on whether or not to continue antibiotics up to her based on how she is feeling.  Prescriptions written for: Zofran  ODT  Other home care instructions discussed: Warm soaks, treatment as above.  ED return instructions discussed: Encouraged the patient to return if she  has fever, worsening lightheadedness or difficulty with ambulation, full syncope, chest pain or shortness of breath.  Follow-up instructions discussed: Patient encouraged to follow-up with their PCP in 3 days.                                   Medical Decision Making Amount and/or Complexity of Data Reviewed Labs: ordered.  Risk Prescription drug management.   Patient presents with lightheadedness today.  No full syncope or chest pain or associated shortness of breath.  Patient was seen for this in the emergency department about 3 weeks ago.  At that time she had an MRI which was negative.  She was diagnosed with UTI which appears to have cleared.  Patient does have a perirectal abscess which was drained at urgent care, appears to be healing well.  Patient was started on Bactrim, she is concerned that she is having a reaction to this.  Overall low concern for significant allergic reaction, possible intolerance, however she seems to have had similar symptoms before.  She felt like this when she had a UTI, and now with an abscess.  Her white blood cell count is elevated but doubt sepsis given her well appearance.  No fever, hypotension, hypoxia.  Mild tachycardia, improved.  Low concern for PE/ACS at this time.  She is able to hydrate herself well.  Do not suspect significant dehydration.  At this time, no indication for further workup or hospitalization.  Encouraged outpatient follow-up.  The patient's vital signs, pertinent lab work and imaging were reviewed and interpreted as discussed in the ED course. Hospitalization was considered for further testing, treatments, or serial exams/observation. However as patient is well-appearing, has a stable exam, and reassuring studies today, I do not feel that they warrant admission at this time. This plan was discussed with the patient who verbalizes agreement and comfort with this plan and seems reliable and able to return to the Emergency Department with  worsening or changing symptoms.  Final diagnoses:  Episodic lightheadedness  Perirectal abscess    ED Discharge Orders          Ordered    ondansetron  (ZOFRAN -ODT) 4 MG disintegrating tablet  Every 8 hours PRN        05/26/24 1158               Desiderio Chew, PA-C 05/26/24 1202    Dean Clarity, MD 05/26/24 1312

## 2024-05-29 ENCOUNTER — Other Ambulatory Visit (HOSPITAL_BASED_OUTPATIENT_CLINIC_OR_DEPARTMENT_OTHER): Payer: Self-pay | Admitting: Certified Nurse Midwife

## 2024-08-27 ENCOUNTER — Other Ambulatory Visit (HOSPITAL_BASED_OUTPATIENT_CLINIC_OR_DEPARTMENT_OTHER): Payer: Self-pay | Admitting: Certified Nurse Midwife
